# Patient Record
Sex: Female | Born: 1990 | Race: White | Hispanic: No | Marital: Single | Smoking: Never smoker
Health system: Southern US, Community
[De-identification: ages and names within clinical notes are randomized; demographics above are authoritative.]

## PROBLEM LIST (undated history)

## (undated) DIAGNOSIS — K219 Gastro-esophageal reflux disease without esophagitis: Secondary | ICD-10-CM

## (undated) HISTORY — DX: Gastro-esophageal reflux disease without esophagitis: K21.9

---
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
## 2018-10-22 LAB — OB RESULTS CONSOLE ANTIBODY SCREEN: Antibody Screen: NEGATIVE

## 2018-10-22 LAB — OB RESULTS CONSOLE ABO/RH: RH Type: POSITIVE

## 2018-10-22 LAB — OB RESULTS CONSOLE HEPATITIS B SURFACE ANTIGEN: Hepatitis B Surface Ag: NEGATIVE

## 2018-10-22 LAB — OB RESULTS CONSOLE GC/CHLAMYDIA
Chlamydia: NEGATIVE
Gonorrhea: NEGATIVE

## 2018-10-22 LAB — OB RESULTS CONSOLE RPR: RPR: NONREACTIVE

## 2018-10-22 LAB — OB RESULTS CONSOLE RUBELLA ANTIBODY, IGM: Rubella: IMMUNE

## 2018-10-22 LAB — OB RESULTS CONSOLE HIV ANTIBODY (ROUTINE TESTING): HIV: NONREACTIVE

## 2019-02-28 LAB — OB RESULTS CONSOLE GBS: GBS: NEGATIVE

## 2019-03-22 ENCOUNTER — Encounter (HOSPITAL_COMMUNITY): Payer: Self-pay | Admitting: *Deleted

## 2019-03-22 ENCOUNTER — Telehealth (HOSPITAL_COMMUNITY): Payer: Self-pay | Admitting: *Deleted

## 2019-03-22 NOTE — Telephone Encounter (Signed)
Preadmission screen  

## 2019-03-25 ENCOUNTER — Telehealth (HOSPITAL_COMMUNITY): Payer: Self-pay | Admitting: *Deleted

## 2019-03-25 NOTE — Telephone Encounter (Signed)
Preadmission screen  

## 2019-03-26 ENCOUNTER — Inpatient Hospital Stay (HOSPITAL_COMMUNITY)
Admission: AD | Admit: 2019-03-26 | Discharge: 2019-03-29 | DRG: 788 | Disposition: A | Discharge status: Home / Self Care | Payer: 59 | Attending: Obstetrics and Gynecology | Admitting: Obstetrics and Gynecology

## 2019-03-26 ENCOUNTER — Inpatient Hospital Stay (HOSPITAL_COMMUNITY): Payer: 59 | Admitting: Anesthesiology

## 2019-03-26 ENCOUNTER — Telehealth (HOSPITAL_COMMUNITY): Payer: Self-pay | Admitting: *Deleted

## 2019-03-26 ENCOUNTER — Encounter (HOSPITAL_COMMUNITY): Payer: Self-pay

## 2019-03-26 ENCOUNTER — Other Ambulatory Visit: Payer: Self-pay

## 2019-03-26 DIAGNOSIS — K219 Gastro-esophageal reflux disease without esophagitis: Secondary | ICD-10-CM | POA: Diagnosis present

## 2019-03-26 DIAGNOSIS — O9962 Diseases of the digestive system complicating childbirth: Secondary | ICD-10-CM | POA: Diagnosis present

## 2019-03-26 DIAGNOSIS — O339 Maternal care for disproportion, unspecified: Secondary | ICD-10-CM | POA: Diagnosis present

## 2019-03-26 DIAGNOSIS — O4292 Full-term premature rupture of membranes, unspecified as to length of time between rupture and onset of labor: Principal | ICD-10-CM | POA: Diagnosis present

## 2019-03-26 DIAGNOSIS — Z20828 Contact with and (suspected) exposure to other viral communicable diseases: Secondary | ICD-10-CM | POA: Diagnosis present

## 2019-03-26 DIAGNOSIS — Z3A40 40 weeks gestation of pregnancy: Secondary | ICD-10-CM | POA: Diagnosis not present

## 2019-03-26 LAB — SARS CORONAVIRUS 2 (TAT 6-24 HRS): SARS Coronavirus 2: NEGATIVE

## 2019-03-26 LAB — COMPREHENSIVE METABOLIC PANEL
ALT: 12 U/L (ref 0–44)
AST: 20 U/L (ref 15–41)
Albumin: 2.8 g/dL — ABNORMAL LOW (ref 3.5–5.0)
Alkaline Phosphatase: 109 U/L (ref 38–126)
Anion gap: 9 (ref 5–15)
BUN: 6 mg/dL (ref 6–20)
CO2: 23 mmol/L (ref 22–32)
Calcium: 8.8 mg/dL — ABNORMAL LOW (ref 8.9–10.3)
Chloride: 106 mmol/L (ref 98–111)
Creatinine, Ser: 0.44 mg/dL (ref 0.44–1.00)
GFR calc Af Amer: >60 mL/min (ref >60)
GFR calc non Af Amer: >60 mL/min (ref >60)
Glucose, Bld: 99 mg/dL (ref 70–99)
Potassium: 3.4 mmol/L — ABNORMAL LOW (ref 3.5–5.1)
Sodium: 138 mmol/L (ref 135–145)
Total Bilirubin: 0.6 mg/dL (ref 0.3–1.2)
Total Protein: 6.1 g/dL — ABNORMAL LOW (ref 6.5–8.1)

## 2019-03-26 LAB — PROTEIN / CREATININE RATIO, URINE
Creatinine, Urine: 78.18 mg/dL
Protein Creatinine Ratio: 0.18 mg/mg{Cre} — ABNORMAL HIGH (ref 0.00–0.15)
Total Protein, Urine: 14 mg/dL

## 2019-03-26 LAB — CBC
HCT: 34.5 % — ABNORMAL LOW (ref 36.0–46.0)
Hemoglobin: 11.5 g/dL — ABNORMAL LOW (ref 12.0–15.0)
MCH: 31.1 pg (ref 26.0–34.0)
MCHC: 33.3 g/dL (ref 30.0–36.0)
MCV: 93.2 fL (ref 80.0–100.0)
Platelets: 280 10*3/uL (ref 150–400)
RBC: 3.7 MIL/uL — ABNORMAL LOW (ref 3.87–5.11)
RDW: 11.7 % (ref 11.5–15.5)
WBC: 11 10*3/uL — ABNORMAL HIGH (ref 4.0–10.5)
nRBC: 0 % (ref 0.0–0.2)

## 2019-03-26 LAB — TYPE AND SCREEN
ABO/RH(D): B POS
Antibody Screen: NEGATIVE

## 2019-03-26 LAB — ABO/RH: ABO/RH(D): B POS

## 2019-03-26 MED ORDER — EPHEDRINE 5 MG/ML INJ: 10.0000 mg | INTRAVENOUS | Status: DC | PRN

## 2019-03-26 MED ORDER — OXYTOCIN BOLUS FROM INFUSION: 500.0000 mL | Freq: Once | INTRAVENOUS | Status: DC

## 2019-03-26 MED ORDER — TERBUTALINE SULFATE 1 MG/ML IJ SOLN: 0.2500 mg | Freq: Once | INTRAMUSCULAR | Status: DC | PRN

## 2019-03-26 MED ORDER — LIDOCAINE HCL (PF) 1 % IJ SOLN: 30.0000 mL | INTRAMUSCULAR | Status: DC | PRN

## 2019-03-26 MED ORDER — PHENYLEPHRINE 40 MCG/ML (10ML) SYRINGE FOR IV PUSH (FOR BLOOD PRESSURE SUPPORT): 80.0000 ug | PREFILLED_SYRINGE | INTRAVENOUS | Status: DC | PRN

## 2019-03-26 MED ORDER — LACTATED RINGERS IV SOLN
INTRAVENOUS | Status: DC
  Administered 2019-03-26 (×2): via INTRAVENOUS

## 2019-03-26 MED ORDER — LACTATED RINGERS IV SOLN: 500.0000 mL | Freq: Once | INTRAVENOUS | Status: DC

## 2019-03-26 MED ORDER — OXYTOCIN 40 UNITS IN NORMAL SALINE INFUSION - SIMPLE MED
1.0000 m[IU]/min | INTRAVENOUS | Status: DC
  Administered 2019-03-26: 2 m[IU]/min via INTRAVENOUS
  Filled 2019-03-26: qty 1000

## 2019-03-26 MED ORDER — DIPHENHYDRAMINE HCL 50 MG/ML IJ SOLN
12.5000 mg | Freq: Once | INTRAMUSCULAR | Status: AC
  Administered 2019-03-26: 12.5 mg via INTRAVENOUS
  Filled 2019-03-26: qty 1

## 2019-03-26 MED ORDER — OXYCODONE-ACETAMINOPHEN 5-325 MG PO TABS: 2.0000 | ORAL_TABLET | ORAL | Status: DC | PRN

## 2019-03-26 MED ORDER — LACTATED RINGERS IV SOLN: 500.0000 mL | INTRAVENOUS | Status: DC | PRN

## 2019-03-26 MED ORDER — FENTANYL-BUPIVACAINE-NACL 0.5-0.125-0.9 MG/250ML-% EP SOLN
12.0000 mL/h | EPIDURAL | Status: DC | PRN
  Filled 2019-03-26: qty 250

## 2019-03-26 MED ORDER — DIPHENHYDRAMINE HCL 50 MG/ML IJ SOLN: 12.5000 mg | INTRAMUSCULAR | Status: DC | PRN

## 2019-03-26 MED ORDER — LIDOCAINE-EPINEPHRINE (PF) 2 %-1:200000 IJ SOLN
INTRAMUSCULAR | Status: DC | PRN
  Administered 2019-03-26 (×2): 2 mL via EPIDURAL
  Administered 2019-03-27 (×2): 5 mL via EPIDURAL
  Administered 2019-03-27: 10 mL via EPIDURAL

## 2019-03-26 MED ORDER — SODIUM CHLORIDE (PF) 0.9 % IJ SOLN
INTRAMUSCULAR | Status: DC | PRN
  Administered 2019-03-26: 12 mL/h via EPIDURAL

## 2019-03-26 MED ORDER — FLEET ENEMA 7-19 GM/118ML RE ENEM: 1.0000 | ENEMA | RECTAL | Status: DC | PRN

## 2019-03-26 MED ORDER — FENTANYL CITRATE (PF) 100 MCG/2ML IJ SOLN
INTRAMUSCULAR | Status: AC
  Filled 2019-03-26: qty 2

## 2019-03-26 MED ORDER — SOD CITRATE-CITRIC ACID 500-334 MG/5ML PO SOLN: 30.0000 mL | ORAL | Status: DC | PRN

## 2019-03-26 MED ORDER — SODIUM CHLORIDE 0.9 % IV SOLN
500.0000 mg | Freq: Once | INTRAVENOUS | Status: AC
  Administered 2019-03-26: 500 mg via INTRAVENOUS
  Filled 2019-03-26: qty 500

## 2019-03-26 MED ORDER — OXYTOCIN 40 UNITS IN NORMAL SALINE INFUSION - SIMPLE MED: 2.5000 [IU]/h | INTRAVENOUS | Status: DC

## 2019-03-26 MED ORDER — ONDANSETRON HCL 4 MG/2ML IJ SOLN
4.0000 mg | Freq: Four times a day (QID) | INTRAMUSCULAR | Status: DC | PRN
  Administered 2019-03-27: 02:00:00 4 mg via INTRAVENOUS

## 2019-03-26 MED ORDER — FENTANYL CITRATE (PF) 100 MCG/2ML IJ SOLN
100.0000 ug | INTRAMUSCULAR | Status: DC | PRN
  Administered 2019-03-26 (×2): 100 ug via INTRAVENOUS
  Filled 2019-03-26: qty 2

## 2019-03-26 MED ORDER — OXYCODONE-ACETAMINOPHEN 5-325 MG PO TABS: 1.0000 | ORAL_TABLET | ORAL | Status: DC | PRN

## 2019-03-26 MED ORDER — ACETAMINOPHEN 325 MG PO TABS: 650.0000 mg | ORAL_TABLET | ORAL | Status: DC | PRN

## 2019-03-26 NOTE — Anesthesia Preprocedure Evaluation (Signed)
Anesthesia Evaluation    Reviewed: Allergy & Precautions, Patient's Chart, lab work & pertinent test results  Airway Mallampati: II  TM Distance: >3 FB Neck ROM: Full    Dental no notable dental hx.    Pulmonary neg pulmonary ROS,    Pulmonary exam normal breath sounds clear to auscultation       Cardiovascular negative cardio ROS Normal cardiovascular exam Rhythm:Regular Rate:Normal     Neuro/Psych negative neurological ROS  negative psych ROS   GI/Hepatic Neg liver ROS, GERD  ,  Endo/Other  negative endocrine ROS  Renal/GU negative Renal ROS  negative genitourinary   Musculoskeletal negative musculoskeletal ROS (+)   Abdominal   Peds  Hematology negative hematology ROS (+)   Anesthesia Other Findings   Reproductive/Obstetrics (+) Pregnancy                             Anesthesia Physical Anesthesia Plan  ASA: II  Anesthesia Plan: Epidural   Post-op Pain Management:    Induction:   PONV Risk Score and Plan: Treatment may vary due to age or medical condition  Airway Management Planned: Natural Airway  Additional Equipment:   Intra-op Plan:   Post-operative Plan:   Informed Consent: I have reviewed the patients History and Physical, chart, labs and discussed the procedure including the risks, benefits and alternatives for the proposed anesthesia with the patient or authorized representative who has indicated his/her understanding and acceptance.       Plan Discussed with: Anesthesiologist  Anesthesia Plan Comments: (Patient identified. Risks, benefits, options discussed with patient including but not limited to bleeding, infection, nerve damage, paralysis, failed block, incomplete pain control, headache, blood pressure changes, nausea, vomiting, reactions to medication, itching, and post partum back pain. Confirmed with bedside nurse the patient's most recent platelet count.  Confirmed with the patient that they are not taking any anticoagulation, have any bleeding history or any family history of bleeding disorders. Patient expressed understanding and wishes to proceed. All questions were answered. )        Anesthesia Quick Evaluation

## 2019-03-26 NOTE — Progress Notes (Signed)
Labor Note  S: More comfortable s/p epidural. Occ nausea  O: BP 126/87   Pulse 64   Temp 98 F (36.7 C) (Oral)   Resp 19   Ht 5\' 6"  (1.676 m)   Wt 69.4 kg   SpO2 94%   BMI 24.69 kg/m  CE: 6/90/-1, bloody show present. FHR: Baseline 110, +accels, + early decels, mod variability TOCO 2-3, pitocin at 48mU/min  A/P: This is a 28 y.o. G1P0 at [redacted]w[redacted]d  admitted w/ PROM. GBS neg.  FWB: Category 1   MWB: Desires epidural. Asx from PreE standpoint. PreE labs WNL    *Rupture > 12 hrs: add IV azithromycin 500mg  at this time Labor course: Active labor at this time. Peanut ball  Anticipate SVD

## 2019-03-26 NOTE — Progress Notes (Signed)
Labor Note  S: Patient feeling more painful contractions and continued leakage. Denies VB or PReE symptoms  O: BP (!) 141/92   Pulse 79   Temp 97.8 F (36.6 C) (Axillary)   Resp 18   Ht 5\' 6"  (1.676 m)   Wt 69.4 kg   SpO2 100%   BMI 24.69 kg/m  CE: 2-3/50/-2 FHR: Baseline 125, +accels, -decels, modvariability TOCO 2-3, pitocin at 37mU/min  A/P: This is a 28 y.o. G1P0 at [redacted]w[redacted]d  admitted w/ PROM. GBS neg.  FWB: Category 1   MWB: Desires epidural. Asx from PreE standpoint. PreE labs WNL, uPC pending Labor course: Latent labor, continue to titrate  Anticipate SVD

## 2019-03-26 NOTE — Telephone Encounter (Signed)
Preadmission screen  

## 2019-03-26 NOTE — H&P (Signed)
Jo [name] is a 28 y.o. female presenting for rupture of membranes at 0500. Evaluated in clinic, + ferning and pooling. +FM, denies VB, CTX  Late entry to care at 17wks, GERD. Otherwise uncomplicated, 1hr 87, GBS neg OB History    Gravida  1   Para      Term      Preterm      AB      Living        SAB      TAB      Ectopic      Multiple      Live Births             Past Medical History:  Diagnosis Date  . GERD (gastroesophageal reflux disease)    Past Surgical History:  Procedure Laterality Date  . NO PAST SURGERIES     Family History: family history is not on file. Social History:  reports that she has never smoked. She has never used smokeless tobacco. She reports previous alcohol use. She reports previous drug use.     Maternal Diabetes: No Genetic Screening: Normal (AFP) Maternal Ultrasounds/Referrals: Normal Had repeat scan in 3rd trim for S<D, growth WNL Fetal Ultrasounds or other Referrals:  None Maternal Substance Abuse:  No Significant Maternal Medications:  None Significant Maternal Lab Results:  Group B Strep negative Other Comments:  None  Review of Systems  Constitutional: Negative for chills and fever.  Eyes: Negative for blurred vision.  Respiratory: Negative for shortness of breath.   Cardiovascular: Negative for chest pain, palpitations and leg swelling.  Gastrointestinal: Negative for abdominal pain, nausea and vomiting.  Neurological: Negative for dizziness, weakness and headaches.  Psychiatric/Behavioral: Negative for suicidal ideas.   Maternal Medical History:  Reason for admission: Rupture of membranes.  Nausea.  Fetal activity: Perceived fetal activity is normal.    Prenatal complications: no prenatal complications Prenatal Complications - Diabetes: none.    Dilation: 1.5 Effacement (%): 50 Station: -3 Exam by:: Irma Newness, RN ) Blood pressure (!) 141/87, pulse 78, temperature 98.4 F (36.9 C), temperature  source Oral, resp. rate 19, height 5\' 6"  (1.676 m), weight 69.4 kg.  TOCO q6-86m, pitocin at 44mU/m   Exam Physical Exam  Constitutional: She is oriented to person, place, and time. She appears well-developed and well-nourished. No distress.  HENT:  Head: Normocephalic and atraumatic.  Eyes: Pupils are equal, round, and reactive to light.  Neck: Normal range of motion. Neck supple.  Cardiovascular: Normal rate and regular rhythm. Exam reveals no gallop.  No murmur heard. GI: There is abdominal tenderness (w/ occ ctxs). There is no rebound and no guarding.  Genitourinary:    Vagina and uterus normal.     Genitourinary Comments: +ferning/pooling in office   Musculoskeletal: Normal range of motion.  Neurological: She is alert and oriented to person, place, and time.  Skin: Skin is warm and dry.    Prenatal labs: ABO, Rh: --/--/PENDING (11/24 1234) Antibody: PENDING (11/24 1234) Rubella: Immune (06/22 0000) RPR: Nonreactive (06/22 0000)  HBsAg: Negative (06/22 0000)  HIV: Non-reactive (06/22 0000)  GBS: Negative/-- (10/29 0000)   Assessment/Plan: This is a 28yo G1P0 at 30 0/7 admitted with PROM. GBS neg, EFW 3250g. On admission, elevated BP  Admit to L&D. Augment with pitocin. Planning on epidural. Baby boy, hospital circ [facility]).   Elevated Bps - asx. PreE labs, will need cath specimen for [facility]   Jo [name] 03/26/2019, 1:20 PM

## 2019-03-26 NOTE — Anesthesia Procedure Notes (Signed)
Epidural Patient location during procedure: OB Start time: 03/26/2019 6:40 PM End time: 03/26/2019 6:55 PM  Staffing Anesthesiologist: Freddrick March, MD Performed: anesthesiologist   Preanesthetic Checklist Completed: patient identified, pre-op evaluation, timeout performed, IV checked, risks and benefits discussed and monitors and equipment checked  Epidural Patient position: sitting Prep: site prepped and draped and DuraPrep Patient monitoring: continuous pulse ox, blood pressure, heart rate and cardiac monitor Approach: midline Location: L3-L4 Injection technique: LOR air  Needle:  Needle type: Tuohy  Needle gauge: 17 G Needle length: 9 cm Needle insertion depth: 4 cm Catheter type: closed end flexible Catheter size: 19 Gauge Catheter at skin depth: 10 cm Test dose: negative  Assessment Sensory level: T8 Events: blood not aspirated, injection not painful, no injection resistance, negative IV test and no paresthesia  Additional Notes Patient identified. Risks/Benefits/Options discussed with patient including but not limited to bleeding, infection, nerve damage, paralysis, failed block, incomplete pain control, headache, blood pressure changes, nausea, vomiting, reactions to medication both or allergic, itching and postpartum back pain. Confirmed with bedside nurse the patient's most recent platelet count. Confirmed with patient that they are not currently taking any anticoagulation, have any bleeding history or any family history of bleeding disorders. Patient expressed understanding and wished to proceed. All questions were answered. Sterile technique was used throughout the entire procedure. Please see nursing notes for vital signs. Test dose was given through epidural catheter and negative prior to continuing to dose epidural or start infusion. Warning signs of high block given to the patient including shortness of breath, tingling/numbness in hands, complete motor block,  or any concerning symptoms with instructions to call for help. Patient was given instructions on fall risk and not to get out of bed. All questions and concerns addressed with instructions to call with any issues or inadequate analgesia.  Reason for block:procedure for pain

## 2019-03-27 ENCOUNTER — Other Ambulatory Visit (HOSPITAL_COMMUNITY): Payer: Self-pay

## 2019-03-27 ENCOUNTER — Encounter (HOSPITAL_COMMUNITY): Payer: Self-pay

## 2019-03-27 ENCOUNTER — Encounter (HOSPITAL_COMMUNITY)
Admission: AD | Disposition: A | Discharge status: Home / Self Care | Payer: Self-pay | Source: Home / Self Care | Attending: Obstetrics and Gynecology

## 2019-03-27 LAB — CBC
HCT: 27.3 % — ABNORMAL LOW (ref 36.0–46.0)
Hemoglobin: 9.1 g/dL — ABNORMAL LOW (ref 12.0–15.0)
MCH: 31.1 pg (ref 26.0–34.0)
MCHC: 33.3 g/dL (ref 30.0–36.0)
MCV: 93.2 fL (ref 80.0–100.0)
Platelets: 244 10*3/uL (ref 150–400)
RBC: 2.93 MIL/uL — ABNORMAL LOW (ref 3.87–5.11)
RDW: 11.7 % (ref 11.5–15.5)
WBC: 19.1 10*3/uL — ABNORMAL HIGH (ref 4.0–10.5)
nRBC: 0 % (ref 0.0–0.2)

## 2019-03-27 LAB — RPR: RPR Ser Ql: NONREACTIVE

## 2019-03-27 SURGERY — Surgical Case
Anesthesia: Epidural | Wound class: Clean Contaminated

## 2019-03-27 MED ORDER — DIPHENHYDRAMINE HCL 25 MG PO CAPS: 25.0000 mg | ORAL_CAPSULE | Freq: Four times a day (QID) | ORAL | Status: DC | PRN

## 2019-03-27 MED ORDER — FENTANYL CITRATE (PF) 100 MCG/2ML IJ SOLN
INTRAMUSCULAR | Status: AC
  Filled 2019-03-27: qty 2

## 2019-03-27 MED ORDER — MORPHINE SULFATE (PF) 0.5 MG/ML IJ SOLN
INTRAMUSCULAR | Status: DC | PRN
  Administered 2019-03-27: 2 mg via INTRAVENOUS
  Administered 2019-03-27: 3 mg via EPIDURAL

## 2019-03-27 MED ORDER — ZOLPIDEM TARTRATE 5 MG PO TABS: 5.0000 mg | ORAL_TABLET | Freq: Every evening | ORAL | Status: DC | PRN

## 2019-03-27 MED ORDER — FENTANYL CITRATE (PF) 100 MCG/2ML IJ SOLN: 25.0000 ug | INTRAMUSCULAR | Status: DC | PRN

## 2019-03-27 MED ORDER — DEXAMETHASONE SODIUM PHOSPHATE 4 MG/ML IJ SOLN
INTRAMUSCULAR | Status: AC
  Filled 2019-03-27: qty 1

## 2019-03-27 MED ORDER — LACTATED RINGERS IV SOLN
INTRAVENOUS | Status: DC | PRN
  Administered 2019-03-27 (×2): via INTRAVENOUS

## 2019-03-27 MED ORDER — OXYTOCIN 40 UNITS IN NORMAL SALINE INFUSION - SIMPLE MED: 2.5000 [IU]/h | INTRAVENOUS | Status: AC

## 2019-03-27 MED ORDER — OXYTOCIN 40 UNITS IN NORMAL SALINE INFUSION - SIMPLE MED
INTRAVENOUS | Status: DC | PRN
  Administered 2019-03-27: 40 [IU] via INTRAVENOUS

## 2019-03-27 MED ORDER — DOCUSATE SODIUM 100 MG PO CAPS
100.0000 mg | ORAL_CAPSULE | Freq: Two times a day (BID) | ORAL | Status: DC
  Administered 2019-03-27 – 2019-03-29 (×5): 100 mg via ORAL
  Filled 2019-03-27 (×5): qty 1

## 2019-03-27 MED ORDER — NALOXONE HCL 4 MG/10ML IJ SOLN
1.0000 ug/kg/h | INTRAVENOUS | Status: DC | PRN
  Filled 2019-03-27: qty 5

## 2019-03-27 MED ORDER — METOCLOPRAMIDE HCL 5 MG/ML IJ SOLN
INTRAMUSCULAR | Status: DC | PRN
  Administered 2019-03-27: 10 mg via INTRAVENOUS

## 2019-03-27 MED ORDER — METOCLOPRAMIDE HCL 5 MG/ML IJ SOLN
INTRAMUSCULAR | Status: AC
  Filled 2019-03-27: qty 2

## 2019-03-27 MED ORDER — OXYCODONE HCL 5 MG PO TABS: 10.0000 mg | ORAL_TABLET | Freq: Four times a day (QID) | ORAL | Status: DC | PRN

## 2019-03-27 MED ORDER — DIPHENHYDRAMINE HCL 50 MG/ML IJ SOLN: 12.5000 mg | INTRAMUSCULAR | Status: DC | PRN

## 2019-03-27 MED ORDER — OXYCODONE HCL 5 MG PO TABS
5.0000 mg | ORAL_TABLET | Freq: Four times a day (QID) | ORAL | Status: DC | PRN
  Administered 2019-03-27 – 2019-03-28 (×3): 5 mg via ORAL
  Filled 2019-03-27 (×3): qty 1

## 2019-03-27 MED ORDER — DIPHENHYDRAMINE HCL 50 MG/ML IJ SOLN
INTRAMUSCULAR | Status: AC
  Filled 2019-03-27: qty 1

## 2019-03-27 MED ORDER — SIMETHICONE 80 MG PO CHEW
80.0000 mg | CHEWABLE_TABLET | ORAL | Status: DC
  Administered 2019-03-27 – 2019-03-29 (×2): 80 mg via ORAL
  Filled 2019-03-27 (×2): qty 1

## 2019-03-27 MED ORDER — LACTATED RINGERS IV SOLN
INTRAVENOUS | Status: DC
  Administered 2019-03-27: 08:00:00 via INTRAVENOUS

## 2019-03-27 MED ORDER — DIPHENHYDRAMINE HCL 25 MG PO CAPS: 25.0000 mg | ORAL_CAPSULE | ORAL | Status: DC | PRN

## 2019-03-27 MED ORDER — PRENATAL MULTIVITAMIN CH: 1.0000 | ORAL_TABLET | Freq: Every day | ORAL | Status: DC

## 2019-03-27 MED ORDER — SIMETHICONE 80 MG PO CHEW
80.0000 mg | CHEWABLE_TABLET | Freq: Three times a day (TID) | ORAL | Status: DC
  Administered 2019-03-27 – 2019-03-28 (×5): 80 mg via ORAL
  Filled 2019-03-27 (×7): qty 1

## 2019-03-27 MED ORDER — PANTOPRAZOLE SODIUM 40 MG PO TBEC
40.0000 mg | DELAYED_RELEASE_TABLET | Freq: Every day | ORAL | Status: DC
  Administered 2019-03-27 – 2019-03-29 (×3): 40 mg via ORAL
  Filled 2019-03-27 (×3): qty 1

## 2019-03-27 MED ORDER — MENTHOL 3 MG MT LOZG: 1.0000 | LOZENGE | OROMUCOSAL | Status: DC | PRN

## 2019-03-27 MED ORDER — NALBUPHINE HCL 10 MG/ML IJ SOLN
5.0000 mg | INTRAMUSCULAR | Status: DC | PRN
  Filled 2019-03-27: qty 0.5

## 2019-03-27 MED ORDER — COCONUT OIL OIL: 1.0000 "application " | TOPICAL_OIL | Status: DC | PRN

## 2019-03-27 MED ORDER — STERILE WATER FOR IRRIGATION IR SOLN
Status: DC | PRN
  Administered 2019-03-27: 1

## 2019-03-27 MED ORDER — ONDANSETRON HCL 4 MG/2ML IJ SOLN
INTRAMUSCULAR | Status: AC
  Filled 2019-03-27: qty 2

## 2019-03-27 MED ORDER — ONDANSETRON HCL 4 MG/2ML IJ SOLN: 4.0000 mg | Freq: Three times a day (TID) | INTRAMUSCULAR | Status: DC | PRN

## 2019-03-27 MED ORDER — OXYTOCIN 40 UNITS IN NORMAL SALINE INFUSION - SIMPLE MED
INTRAVENOUS | Status: AC
  Filled 2019-03-27: qty 1000

## 2019-03-27 MED ORDER — DEXAMETHASONE SODIUM PHOSPHATE 4 MG/ML IJ SOLN
INTRAMUSCULAR | Status: DC | PRN
  Administered 2019-03-27: 4 mg via INTRAVENOUS

## 2019-03-27 MED ORDER — CEFAZOLIN SODIUM-DEXTROSE 2-3 GM-%(50ML) IV SOLR
INTRAVENOUS | Status: DC | PRN
  Administered 2019-03-27: 2 g via INTRAVENOUS

## 2019-03-27 MED ORDER — WITCH HAZEL-GLYCERIN EX PADS: 1.0000 "application " | MEDICATED_PAD | CUTANEOUS | Status: DC | PRN

## 2019-03-27 MED ORDER — KETOROLAC TROMETHAMINE 30 MG/ML IJ SOLN
30.0000 mg | Freq: Four times a day (QID) | INTRAMUSCULAR | Status: AC | PRN
  Administered 2019-03-27: 30 mg via INTRAMUSCULAR

## 2019-03-27 MED ORDER — PHENYLEPHRINE 40 MCG/ML (10ML) SYRINGE FOR IV PUSH (FOR BLOOD PRESSURE SUPPORT)
PREFILLED_SYRINGE | INTRAVENOUS | Status: AC
  Filled 2019-03-27: qty 10

## 2019-03-27 MED ORDER — CEFAZOLIN SODIUM-DEXTROSE 2-4 GM/100ML-% IV SOLN
INTRAVENOUS | Status: AC
  Filled 2019-03-27: qty 100

## 2019-03-27 MED ORDER — NALBUPHINE HCL 10 MG/ML IJ SOLN: 5.0000 mg | Freq: Once | INTRAMUSCULAR | Status: DC | PRN

## 2019-03-27 MED ORDER — DIPHENHYDRAMINE HCL 50 MG/ML IJ SOLN
INTRAMUSCULAR | Status: DC | PRN
  Administered 2019-03-27: 25 mg via INTRAVENOUS

## 2019-03-27 MED ORDER — KETOROLAC TROMETHAMINE 30 MG/ML IJ SOLN
INTRAMUSCULAR | Status: AC
  Filled 2019-03-27: qty 1

## 2019-03-27 MED ORDER — MORPHINE SULFATE (PF) 0.5 MG/ML IJ SOLN
INTRAMUSCULAR | Status: AC
  Filled 2019-03-27: qty 10

## 2019-03-27 MED ORDER — SIMETHICONE 80 MG PO CHEW: 80.0000 mg | CHEWABLE_TABLET | ORAL | Status: DC | PRN

## 2019-03-27 MED ORDER — SODIUM CHLORIDE 0.9 % IR SOLN
Status: DC | PRN
  Administered 2019-03-27 (×2): 1

## 2019-03-27 MED ORDER — IBUPROFEN 800 MG PO TABS
800.0000 mg | ORAL_TABLET | Freq: Four times a day (QID) | ORAL | Status: DC
  Administered 2019-03-28 – 2019-03-29 (×6): 800 mg via ORAL
  Filled 2019-03-27 (×6): qty 1

## 2019-03-27 MED ORDER — PRENATAL MULTIVITAMIN CH
1.0000 | ORAL_TABLET | Freq: Every day | ORAL | Status: DC
  Administered 2019-03-27 – 2019-03-29 (×3): 1 via ORAL
  Filled 2019-03-27 (×3): qty 1

## 2019-03-27 MED ORDER — PHENYLEPHRINE HCL (PRESSORS) 10 MG/ML IV SOLN
INTRAVENOUS | Status: DC | PRN
  Administered 2019-03-27 (×2): 80 ug via INTRAVENOUS

## 2019-03-27 MED ORDER — SCOPOLAMINE 1 MG/3DAYS TD PT72: 1.0000 | MEDICATED_PATCH | Freq: Once | TRANSDERMAL | Status: DC

## 2019-03-27 MED ORDER — SENNOSIDES-DOCUSATE SODIUM 8.6-50 MG PO TABS
2.0000 | ORAL_TABLET | ORAL | Status: DC
  Administered 2019-03-29: 2 via ORAL
  Filled 2019-03-27 (×2): qty 2

## 2019-03-27 MED ORDER — ACETAMINOPHEN 500 MG PO TABS
1000.0000 mg | ORAL_TABLET | Freq: Four times a day (QID) | ORAL | Status: AC
  Administered 2019-03-27 – 2019-03-28 (×4): 1000 mg via ORAL
  Filled 2019-03-27 (×4): qty 2

## 2019-03-27 MED ORDER — HYDROMORPHONE HCL 1 MG/ML IJ SOLN
INTRAMUSCULAR | Status: DC | PRN
  Administered 2019-03-27 (×2): 0.5 mg via INTRAVENOUS

## 2019-03-27 MED ORDER — NALOXONE HCL 0.4 MG/ML IJ SOLN: 0.4000 mg | INTRAMUSCULAR | Status: DC | PRN

## 2019-03-27 MED ORDER — FENTANYL CITRATE (PF) 100 MCG/2ML IJ SOLN
INTRAMUSCULAR | Status: DC | PRN
  Administered 2019-03-27: 100 ug via EPIDURAL

## 2019-03-27 MED ORDER — FENTANYL CITRATE (PF) 100 MCG/2ML IJ SOLN: INTRAMUSCULAR | Status: DC | PRN

## 2019-03-27 MED ORDER — KETOROLAC TROMETHAMINE 30 MG/ML IJ SOLN
30.0000 mg | Freq: Four times a day (QID) | INTRAMUSCULAR | Status: AC
  Administered 2019-03-27 (×3): 30 mg via INTRAVENOUS
  Filled 2019-03-27 (×3): qty 1

## 2019-03-27 MED ORDER — SODIUM CHLORIDE 0.9% FLUSH: 3.0000 mL | INTRAVENOUS | Status: DC | PRN

## 2019-03-27 MED ORDER — SODIUM CHLORIDE 0.9 % IV SOLN
INTRAVENOUS | Status: DC | PRN
  Administered 2019-03-27: 02:00:00 via INTRAVENOUS

## 2019-03-27 MED ORDER — ACETAMINOPHEN 10 MG/ML IV SOLN: 1000.0000 mg | Freq: Once | INTRAVENOUS | Status: DC | PRN

## 2019-03-27 MED ORDER — DIBUCAINE (PERIANAL) 1 % EX OINT: 1.0000 "application " | TOPICAL_OINTMENT | CUTANEOUS | Status: DC | PRN

## 2019-03-27 MED ORDER — TETANUS-DIPHTH-ACELL PERTUSSIS 5-2.5-18.5 LF-MCG/0.5 IM SUSP: 0.5000 mL | Freq: Once | INTRAMUSCULAR | Status: DC

## 2019-03-27 MED ORDER — KETOROLAC TROMETHAMINE 30 MG/ML IJ SOLN: 30.0000 mg | Freq: Four times a day (QID) | INTRAMUSCULAR | Status: AC | PRN

## 2019-03-27 MED ORDER — HYDROMORPHONE HCL 1 MG/ML IJ SOLN
INTRAMUSCULAR | Status: AC
  Filled 2019-03-27: qty 1

## 2019-03-27 MED ORDER — KETOROLAC TROMETHAMINE 30 MG/ML IJ SOLN: 30.0000 mg | Freq: Once | INTRAMUSCULAR | Status: DC | PRN

## 2019-03-27 SURGICAL SUPPLY — 37 items
CHLORAPREP W/TINT 26ML (MISCELLANEOUS) ×3 IMPLANT
CLAMP CORD UMBIL (MISCELLANEOUS) IMPLANT
CLOTH BEACON ORANGE TIMEOUT ST (SAFETY) ×3 IMPLANT
DERMABOND ADVANCED (GAUZE/BANDAGES/DRESSINGS) ×2
DERMABOND ADVANCED .7 DNX12 (GAUZE/BANDAGES/DRESSINGS) ×1 IMPLANT
DRSG OPSITE POSTOP 4X10 (GAUZE/BANDAGES/DRESSINGS) ×3 IMPLANT
ELECT REM PT RETURN 9FT ADLT (ELECTROSURGICAL) ×3
ELECTRODE REM PT RTRN 9FT ADLT (ELECTROSURGICAL) ×1 IMPLANT
EXTRACTOR VACUUM BELL STYLE (SUCTIONS) IMPLANT
GAUZE SPONGE 4X4 12PLY STRL LF (GAUZE/BANDAGES/DRESSINGS) IMPLANT
GLOVE BIO SURGEON STRL SZ 6 (GLOVE) ×3 IMPLANT
GLOVE BIOGEL PI IND STRL 6.5 (GLOVE) ×1 IMPLANT
GLOVE BIOGEL PI IND STRL 7.0 (GLOVE) ×2 IMPLANT
GLOVE BIOGEL PI INDICATOR 6.5 (GLOVE) ×2
GLOVE BIOGEL PI INDICATOR 7.0 (GLOVE) ×4
GOWN STRL REUS W/TWL LRG LVL3 (GOWN DISPOSABLE) ×6 IMPLANT
HEMOSTAT SURGICEL 4X8 (HEMOSTASIS) ×3 IMPLANT
KIT ABG SYR 3ML LUER SLIP (SYRINGE) ×3 IMPLANT
NEEDLE HYPO 25X5/8 SAFETYGLIDE (NEEDLE) ×3 IMPLANT
NS IRRIG 1000ML POUR BTL (IV SOLUTION) ×3 IMPLANT
PACK C SECTION WH (CUSTOM PROCEDURE TRAY) ×3 IMPLANT
PAD ABD 8X10 STRL (GAUZE/BANDAGES/DRESSINGS) IMPLANT
PAD OB MATERNITY 4.3X12.25 (PERSONAL CARE ITEMS) ×3 IMPLANT
PENCIL SMOKE EVAC W/HOLSTER (ELECTROSURGICAL) ×3 IMPLANT
RTRCTR C-SECT PINK 25CM LRG (MISCELLANEOUS) IMPLANT
SUT PDS AB 0 CTX 36 PDP370T (SUTURE) IMPLANT
SUT PLAIN 2 0 (SUTURE)
SUT PLAIN ABS 2-0 CT1 27XMFL (SUTURE) IMPLANT
SUT VIC AB 0 CT1 36 (SUTURE) ×15 IMPLANT
SUT VIC AB 2-0 CT1 27 (SUTURE) ×4
SUT VIC AB 2-0 CT1 TAPERPNT 27 (SUTURE) ×2 IMPLANT
SUT VIC AB 3-0 SH 27 (SUTURE)
SUT VIC AB 3-0 SH 27X BRD (SUTURE) IMPLANT
SUT VIC AB 4-0 KS 27 (SUTURE) ×3 IMPLANT
TOWEL OR 17X24 6PK STRL BLUE (TOWEL DISPOSABLE) ×3 IMPLANT
TRAY FOLEY W/BAG SLVR 14FR LF (SET/KITS/TRAYS/PACK) ×3 IMPLANT
WATER STERILE IRR 1000ML POUR (IV SOLUTION) ×3 IMPLANT

## 2019-03-27 NOTE — Progress Notes (Signed)
Subjective: Postpartum Day 0: Cesarean Delivery Patient reports incisional pain and tolerating PO.  Nl lochia, pain controlled  Objective: Vital signs in last 24 hours: Temp:  [96.3 F (35.7 C)-99.4 F (37.4 C)] 98.7 F (37.1 C) (11/25 0747) Pulse Rate:  [62-103] 83 (11/25 0747) Resp:  [13-21] 20 (11/25 0747) BP: (102-160)/(61-108) 124/75 (11/25 0747) SpO2:  [93 %-100 %] 95 % (11/25 0747) Weight:  [69.4 kg] 69.4 kg (11/24 1230)  Physical Exam:  General: alert and no distress Lochia: appropriate Uterine Fundus: firm Incision: healing well DVT Evaluation: No evidence of DVT seen on physical exam.  Recent Labs    03/26/19 1233 03/27/19 0527  HGB 11.5* 9.1*  HCT 34.5* 27.3*    Assessment/Plan: Status post Cesarean section. Doing well postoperatively.  Continue current care.  Genessis Flanary Bovard-Stuckert 03/27/2019, 8:58 AM

## 2019-03-27 NOTE — Progress Notes (Signed)
[facility] 5th floor called to give report to receiving RN per SBAR.

## 2019-03-27 NOTE — Anesthesia Postprocedure Evaluation (Signed)
Anesthesia Post Note  Patient: Jo [name]  Procedure(s) Performed: CESAREAN SECTION (N/A )     Patient location during evaluation: PACU Anesthesia Type: Epidural Level of consciousness: oriented and awake and alert Pain management: pain level controlled Vital Signs Assessment: post-procedure vital signs reviewed and stable Respiratory status: spontaneous breathing, respiratory function stable and patient connected to nasal cannula oxygen Cardiovascular status: blood pressure returned to baseline and stable Postop Assessment: no headache, no backache, no apparent nausea or vomiting and epidural receding Anesthetic complications: no    Last Vitals:  Vitals:   03/27/19 0400 03/27/19 0425  BP:  124/62  Pulse: (!) 102 97  Resp: (!) 21 18  Temp:  37 C  SpO2: 99% 97%    Last Pain:  Vitals:   03/27/19 0425  TempSrc: Axillary  PainSc: 0-No pain   Pain Goal: Patients Stated Pain Goal: 5 (03/26/19 1755)              Epidural/Spinal Function Cutaneous sensation: Able to Wiggle Toes (03/27/19 0425), Patient able to flex knees: Yes (03/27/19 0425), Patient able to lift hips off bed: Yes (03/27/19 0425), Back pain beyond tenderness at insertion site: No (03/27/19 0425), Progressively worsening motor and/or sensory loss: No (03/27/19 0425), Bowel and/or bladder incontinence post epidural: No (03/27/19 0425)  [place]

## 2019-03-27 NOTE — Op Note (Signed)
C-Section Operative Note  Primary Low Transverse Cesarean Section   Date: 03/27/19  Preoperative Diagnosis: IUP @ 40 1/7, failed operative delivery Postoperative Diagnosis: Same as above; cephalopelvic disporportion Assistant: Dr Loma Boston Indications: Failed operative delivery Findings: Viable female infant weighing 3521 with APGARS of 8 and 9 at 1 and 5 minutes, respectively. Normal appearing uterus, bilateral fallopian tubes and ovaries. Specimens: Placenta with calcifications evident EBL 533 IVF 2800 UOP 100  Cord gas: pH art 7.308 In OR 0145, skin 0153, baby 0157  Patient Course: Patient initially admitted on 11/24 @ 40 0/7 with PROM after confirming rupture in-office which patient stated occurred at 0500. Patient admitted and started on pitocin for augmentation. Patient suibsequently made change and was complete with strong urge to push at approx 0100. As per earlier note, patient met criteria for operative vaginal delivery and Mity Vac vacuum was attempted x3. After last pop-off, stat section called for failed operative delivery. FHRs 70s-100s at time of call. IV azithromycin 568m had been ordered earlier as patient was >12hrs ruptured. 2g Ancef on call to OR  Consent:  R/B/A of cesarean section discussed with patient. Alternative would be vaginal delivery which would mean shorter postpartum stay and decreased risk of bleeding. Risks of section include infection of the uterus, pelvic organs, or skin, inadvertent injury to internal organs, such as bowel or bladder. If there is major injury, extensive surgery may be required. If injury is minor, it may be treated with relative ease. Discussed possibility of excessive blood loss and transfusion. If bleeding cannot be controlled using medical or minor surgical methods, a cesarean hysterectomy may be performed which would mean no future fertility. Patient accepts the possibility of blood transfusion, if necessary. Patient understands and  agrees to move forward with section.   Operative Procedure: Patient was taken to the operating room where epidural anesthesia was found to be adequate by Allis clamp test. She was prepped and draped in the normal sterile fashion in the dorsal supine position with a leftward tilt. An appropriate time out was performed. A Pfannenstiel skin incision was then made with the scalpel and carried through to the underlying layer of fascia by sharp dissection. The fascia was nicked in the midline and the incision was extended laterally bluntly. The superior aspect and inferior aspect of the rectus fascia were bluntly dissected off of the bellies. Rectus muscles were separated in the midline and the peritoneal cavity entered bluntly. The peritoneal incision was then extended both superiorly and inferiorly with careful attention to avoid both bowel and bladder. The Richardson retractor with bladder blade were then placed within the incision and the lower uterine segment exposed. The bladder flap was developed with Metzenbaum scissors and pushed away from the lower uterine segment. The lower uterine segment was then incised in a low transverse fashion and the cavity itself entered bluntly. The incision was extended bluntly. Amniotic sac was ruptured and fluid was noted to be clear in color. The infant's head was noted to be very low within pelvis which was felt to be narrow and consistent with CPD. The infant's head was then lifted with aid from DJanann August RN, from below sterile drape using hand placed in vagina. After elevation, infant's head was delivered from the incision without difficulty using the standard movements. The remainder of the infant delivered and the nose and mouth bulb suctioned with the cord clamped and cut as well. The infant was handed off to NICU. Cord gas obtained. The placenta was then  spontaneously expressed from the uterus and the uterus cleared of all clots and debris with moist lap sponge.  Placenta noted as in findings.  The uterine incision was inspected with concern for extension on right apex. Dr Nehemiah Settle called to assist at this time as fetal station was quite low and there was increased concerned for bladder injury via incorporation into the hysterotomy repair. The posterior lip of the hysterotomy was identified and grasped with ring forceps. The Hysterotomy was then repaired in 2 layers the first layer was a running locked layer of 0-vicryl and the second an imbricating layer of the same suture. Of note, 2.5cm extension noted at right apex which was first closed with 0-vicryl running locked before becoming incorporated with imbricating second layer. The tubes and ovaries were inspected and the gutters cleared of all clots and debris. The uterine incision was inspected and found to be hemostatic. All instruments and sponges were then removed from the abdomen. The peritoneum was then reapproximated using 2-0 vicryl suture. The fascia was then closed with 0 Vicryl in a running fashion. The skin was closed with a subcuticular stitch of 4-0 Vicryl on a Keith needle and then reinforced with Dermabond and a Honeycomb dressing. At the conclusion of the procedure all instruments and sponge counts were correct. Patient was taken to the recovery room in good condition with her baby accompanying her.   Of note, after speaking to patient's husband, patient's mother reportedly also had a "narrow pelvis" with patient and had long second stage labor before requiring a stat section.

## 2019-03-27 NOTE — Transfer of Care (Signed)
Immediate Anesthesia Transfer of Care Note  Patient: Jo [name]  Procedure(s) Performed: CESAREAN SECTION (N/A )  Patient Location: PACU  Anesthesia Type:Epidural  Level of Consciousness: awake, alert  and oriented  Airway & Oxygen Therapy: Patient Spontanous Breathing  Post-op Assessment: Report given to RN and Post -op Vital signs reviewed and stable  Post vital signs: Reviewed and stable  Last Vitals:  Vitals Value Taken Time  BP 119/68 03/27/19 0305  Temp    Pulse 103 03/27/19 0306  Resp 13 03/27/19 0306  SpO2 100 % 03/27/19 0306  Vitals shown include unvalidated device data.  Last Pain:  Vitals:   03/26/19 2230  TempSrc: Oral  PainSc:       Patients Stated Pain Goal: 5 (93/81/01 7510)  Complications: No apparent anesthesia complications

## 2019-03-27 NOTE — Lactation Note (Signed)
This note was copied from a baby's chart. Lactation Consultation Note  Patient Name: Jo [name] CVELF'Y Date: 03/27/2019 Reason for consult: Initial assessment;Term;Primapara;1st time breastfeeding  53 hours old FT female who is being mostly BF by his mother, she's a P1. Mom reported (+) breast changes during the pregnancy, and she's already familiar with hand expression her RN Darleene Cleaver has been helping her and they've spoon fed baby already. Mom called out lactation due to a difficult latch, this is a C/S baby explained to mom about the first 24 hours behavior. She has a DEBP at home that she got through her insurance.  Mom and baby doing STS when entering the room, offered assistance with latch and mom agreed to wake baby up to feed. LC assisted mom with hand expression and she was able to get several drops of colostrum very easily. LC took baby STS to mother's left breast and he was able to latch after a few attempts.  LC had to do some suck training after waking baby up, he was crying and opened his mouth really wide, some repositioning and flanging of the lips was required, but baby fed for a total of 12 minutes with a few audible swallows noted, she also stated the this particular feeding felt comfortable, mom very pleased, praised her for her efforts.  Baby fell asleep because LC had to cover him with the blanket as soon as mom told LC that he just had his bath; he probably got too warm and comfortable and fell asleep at the breast. LC resumed STS with mom/baby as soon as this feeding session was over. Reviewed normal newborn behavior, feeding cues, cluster feeding, tips for a deep latch and prevention/treatment for sore nipples.  Feeding plan:  1. Encouraged mom to feed baby STS 8-12 times/24 hours or sooner if feeding cues are present 2. Hand expression and spoon feeding were also encouraged  BF brochure, BF resources and feeding diary were reviewed. Dad present but asleep on the  couch. Mom reported all questions and concerns were answered, she's aware of [facility] OP services and will call PRN.  Maternal Data Formula Feeding for Exclusion: No Has patient been taught Hand Expression?: Yes Does the patient have breastfeeding experience prior to this delivery?: No  Feeding Feeding Type: Breast Fed  LATCH Score Latch: Repeated attempts needed to sustain latch, nipple held in mouth throughout feeding, stimulation needed to elicit sucking reflex.  Audible Swallowing: A few with stimulation  Type of Nipple: Everted at rest and after stimulation  Comfort (Breast/Nipple): Soft / non-tender  Hold (Positioning): Assistance needed to correctly position infant at breast and maintain latch.  LATCH Score: 7  Interventions Interventions: Breast feeding basics reviewed;Assisted with latch;Skin to skin;Breast massage;Hand express;Breast compression;Adjust position;Support pillows  Lactation Tools Discussed/Used WIC Program: No   Consult Status Consult Status: Follow-up Date: 03/28/19 Follow-up type: In-patient    Jo [name] 03/27/2019, 3:13 PM

## 2019-03-27 NOTE — Progress Notes (Signed)
*  LATE ENTRY 2/2 STAT SECTION*  Called to bedside at 0120 for patient's desire to push and fHR decels. Repetitive variable decels with baseline dropping form 100 > 90. CE complete, +1. Poor maternal effort despite coaching, repetitive variables persisted. Patient desired continued attempts at vaginal delivery, therefore RBA for a vacuum was reviewed including hemorrhage beneath fetal scalp, laceration or injury to fetal facial nerve, hemorrhage behind fetal eyes. Patient amenable.  Mity Vac Kiwi placed at 0130, clear of vaginal tissue. A total of 3 pulls and 3 pop-offs occurred; NICU present prior to first pull. Throughout contractions, poor maternal effort noted likely from anxiety as epidural seemed to be controlling pain. After 3 pop-offs, failed vacuum delivery called. Fetal caput still at +1. Stat section called at this time. Please see operative note for following details.

## 2019-03-28 NOTE — Lactation Note (Signed)
This note was copied from a baby's chart. Lactation Consultation Note  Patient Name: Jo [name] [id number] Date: 03/28/2019 Reason for consult: Follow-up assessment;Term;Primapara;1st time breastfeeding  P1 mother whose infant is now 30 hours old.  Baby was asleep in the bassinet when I arrived.  Both parents were attempting to nap.  Mother has given formula the last five feedings.  Questioned mother about her feedings and offered to help.  Mother was laying on her side and stated that she was sore.  Offered the option of setting up the DEBP to help stimulate breasts and increase milk supply.  Suggested that pumping will help with supply and she could feed back her EBM instead of using formula as a supplement after breast feeding.  Mother appreciated this idea but was tired.  I offered to put up a "quiet sign" and to return after her nap to intiate the DEBP.  Mother agreeable.  RN updated and she voiced that she would help me set up the pump after mother's nap.  Mother will call for latch assistance as needed.   Maternal Data    Feeding Feeding Type: Bottle Fed - Formula Nipple Type: Slow - flow  LATCH Score                   Interventions    Lactation Tools Discussed/Used     Consult Status Consult Status: Follow-up Date: 03/29/19 Follow-up type: In-patient    Little Ishikawa 03/28/2019, 3:51 PM

## 2019-03-28 NOTE — Progress Notes (Signed)
Subjective: Postpartum Day #1: Cesarean Delivery Patient reports incisional pain, tolerating PO and no problems voiding.    Objective: Vital signs in last 24 hours: Temp:  [98 F (36.7 C)-98.3 F (36.8 C)] 98 F (36.7 C) (11/25 2101) Pulse Rate:  [72-76] 72 (11/26 0606) Resp:  [18-20] 20 (11/26 0606) BP: (113-124)/(76-83) 116/83 (11/26 0606) SpO2:  [97 %-100 %] 98 % (11/26 0606)  Physical Exam:  General: alert Lochia: appropriate Uterine Fundus: firm Incision: dressing C/D/I   Recent Labs    03/26/19 1233 03/27/19 0527  HGB 11.5* 9.1*  HCT 34.5* 27.3*    Assessment/Plan: Status post Cesarean section. Doing well postoperatively.  Continue current care, ambulate.  Blane Ohara Rylend Pietrzak 03/28/2019, 8:47 AM

## 2019-03-29 ENCOUNTER — Inpatient Hospital Stay (HOSPITAL_COMMUNITY): Payer: 59

## 2019-03-29 LAB — SURGICAL PATHOLOGY

## 2019-03-29 MED ORDER — OXYCODONE HCL 5 MG PO TABS: 5.0000 mg | ORAL_TABLET | ORAL | 0 refills | Status: DC | PRN

## 2019-03-29 MED ORDER — IBUPROFEN 800 MG PO TABS: 800.0000 mg | ORAL_TABLET | Freq: Three times a day (TID) | ORAL | 0 refills | Status: DC | PRN

## 2019-03-29 NOTE — Discharge Instructions (Signed)
As per discharge pamphlet °

## 2019-03-29 NOTE — Progress Notes (Signed)
POD #2 LTCS Doing well, wants to go home Afeb, VSS Abd- soft, fundus firm, incision intact D/c home 

## 2019-03-29 NOTE — Discharge Summary (Signed)
OB Discharge Summary     Patient Name: Jo [name] DOB: 01-19-1991 MRN: [id number]  Date of admission: 03/26/2019 Delivering MD: Eula Flax M   Date of discharge: 03/29/2019  Admitting diagnosis: Pregnancy Intrauterine pregnancy: [redacted]w[redacted]d     Secondary diagnosis:  Active Problems:   [redacted] weeks gestation of pregnancy      Discharge diagnosis: Term Pregnancy Delivered and arrest of descent                                                                                                Hospital course:  Onset of Labor With Unplanned C/S  28 y.o. yo G1P1001 at [redacted]w[redacted]d was admitted in Latent Labor with PROM on 03/26/2019. Patient had a labor course significant for FHR decels once complete, suboptimal pushing effort. Membrane Rupture Time/Date: 5:00 AM ,03/26/2019   The patient went for cesarean section due to Arrest of Descent and FHR decels, and delivered a Viable infant,03/27/2019  Details of operation can be found in separate operative note. Patient had an uncomplicated postpartum course.  She is ambulating,tolerating a regular diet, passing flatus, and urinating well.  Patient is discharged home in stable condition 03/29/19.  Physical exam  Vitals:   03/28/19 0606 03/28/19 1310 03/28/19 2207 03/29/19 0541  BP: 116/83 109/81 108/73 129/78  Pulse: 72 86 85 68  Resp: 20 20 18 18   Temp:  98.3 F (36.8 C) 98 F (36.7 C) 97.8 F (36.6 C)  TempSrc:  Oral Oral Oral  SpO2: 98%     Weight:      Height:       General: alert Lochia: appropriate Uterine Fundus: firm Incision: Healing well with no significant drainage  Labs: Lab Results  Component Value Date   WBC 19.1 (H) 03/27/2019   HGB 9.1 (L) 03/27/2019   HCT 27.3 (L) 03/27/2019   MCV 93.2 03/27/2019   PLT 244 03/27/2019   CMP Latest Ref Rng & Units 03/26/2019  Glucose 70 - 99 mg/dL 99  BUN 6 - 20 mg/dL 6  Creatinine 0.44 - 1.00 mg/dL 0.44  Sodium 135 - 145 mmol/L 138  Potassium 3.5 - 5.1 mmol/L 3.4(L)  Chloride 98  - 111 mmol/L 106  CO2 22 - 32 mmol/L 23  Calcium 8.9 - 10.3 mg/dL 8.8(L)  Total Protein 6.5 - 8.1 g/dL 6.1(L)  Total Bilirubin 0.3 - 1.2 mg/dL 0.6  Alkaline Phos 38 - 126 U/L 109  AST 15 - 41 U/L 20  ALT 0 - 44 U/L 12    Discharge instruction: per After Visit Summary and "Baby and Me Booklet".  After visit meds:  Allergies as of 03/29/2019   No Known Allergies     Medication List    STOP taking these medications   pantoprazole 40 MG tablet Commonly known as: PROTONIX     TAKE these medications   ibuprofen 800 MG tablet Commonly known as: ADVIL Take 1 tablet (800 mg total) by mouth every 8 (eight) hours as needed.   oxyCODONE 5 MG immediate release tablet Commonly known as: Oxy IR/ROXICODONE Take 1 tablet (5 mg total) by  mouth every 4 (four) hours as needed for severe pain.   prenatal multivitamin Tabs tablet Take 1 tablet by mouth daily at 12 noon.       Diet: routine diet  Activity: Advance as tolerated. Pelvic rest for 6 weeks.   Outpatient follow up:2 weeks  Newborn Data: Live born female  Birth Weight: 7 lb 12.2 oz (3521 g) APGAR: 8, 9  Newborn Delivery   Birth date/time: 03/27/2019 01:57:00 Delivery type: C-Section, Low Transverse Trial of labor: Yes C-section categorization: Primary      Baby Feeding: Bottle and Breast Disposition:home with mother   03/29/2019 Zenaida Niece, MD

## 2019-03-29 NOTE — Lactation Note (Signed)
This note was copied from a baby's chart. Lactation Consultation Note: Staff nurse reports that mother is only bottle feeding today. She reports that she will ask mother if she plans to breastfeed and wants to see Rives.  Patient Name: Jo [name] [id number] Date: 03/29/2019     Maternal Data    Feeding Feeding Type: Breast Milk with Formula added Nipple Type: Slow - flow  LATCH Score                   Interventions    Lactation Tools Discussed/Used     Consult Status      Darla Lesches 03/29/2019, 3:18 PM

## 2020-02-12 LAB — OB RESULTS CONSOLE GC/CHLAMYDIA
Chlamydia: NEGATIVE
Gonorrhea: NEGATIVE

## 2020-02-12 LAB — OB RESULTS CONSOLE RPR: RPR: NONREACTIVE

## 2020-02-12 LAB — OB RESULTS CONSOLE HEPATITIS B SURFACE ANTIGEN: Hepatitis B Surface Ag: NEGATIVE

## 2020-02-12 LAB — OB RESULTS CONSOLE HIV ANTIBODY (ROUTINE TESTING): HIV: NONREACTIVE

## 2020-02-12 LAB — OB RESULTS CONSOLE RUBELLA ANTIBODY, IGM: Rubella: IMMUNE

## 2020-02-12 LAB — OB RESULTS CONSOLE ANTIBODY SCREEN: Antibody Screen: NEGATIVE

## 2020-02-12 LAB — OB RESULTS CONSOLE ABO/RH: RH Type: POSITIVE

## 2020-05-12 ENCOUNTER — Other Ambulatory Visit: Payer: Self-pay

## 2020-05-12 ENCOUNTER — Inpatient Hospital Stay (HOSPITAL_COMMUNITY)
Admission: AD | Admit: 2020-05-12 | Discharge: 2020-05-13 | Disposition: A | Discharge status: Home / Self Care | Payer: No Typology Code available for payment source | Attending: Obstetrics and Gynecology | Admitting: Obstetrics and Gynecology

## 2020-05-12 DIAGNOSIS — N858 Other specified noninflammatory disorders of uterus: Secondary | ICD-10-CM | POA: Diagnosis not present

## 2020-05-12 DIAGNOSIS — R109 Unspecified abdominal pain: Secondary | ICD-10-CM

## 2020-05-12 DIAGNOSIS — O26892 Other specified pregnancy related conditions, second trimester: Secondary | ICD-10-CM | POA: Diagnosis not present

## 2020-05-12 DIAGNOSIS — R102 Pelvic and perineal pain: Secondary | ICD-10-CM | POA: Diagnosis not present

## 2020-05-12 DIAGNOSIS — Z3A23 23 weeks gestation of pregnancy: Secondary | ICD-10-CM | POA: Insufficient documentation

## 2020-05-12 DIAGNOSIS — O2342 Unspecified infection of urinary tract in pregnancy, second trimester: Secondary | ICD-10-CM

## 2020-05-12 DIAGNOSIS — Z3A40 40 weeks gestation of pregnancy: Secondary | ICD-10-CM

## 2020-05-12 DIAGNOSIS — O26899 Other specified pregnancy related conditions, unspecified trimester: Secondary | ICD-10-CM

## 2020-05-12 LAB — URINALYSIS, ROUTINE W REFLEX MICROSCOPIC
Bilirubin Urine: NEGATIVE
Glucose, UA: NEGATIVE mg/dL
Ketones, ur: NEGATIVE mg/dL
Nitrite: NEGATIVE
Protein, ur: 30 mg/dL — AB
RBC / HPF: >50 RBC/hpf — ABNORMAL HIGH (ref 0–5)
Specific Gravity, Urine: 1.009 (ref 1.005–1.030)
pH: 6 (ref 5.0–8.0)

## 2020-05-12 MED ORDER — IBUPROFEN 600 MG PO TABS
600.0000 mg | ORAL_TABLET | Freq: Once | ORAL | Status: AC
  Administered 2020-05-12: 600 mg via ORAL
  Filled 2020-05-12: qty 1

## 2020-05-12 NOTE — MAU Note (Signed)
..  Jo [name] is a 30 y.o. at [redacted]w[redacted]d here in MAU reporting: Intermittent lower abdominal pain that began around 1830. Patient describes the feeling as period-like. Denies vaginal bleeding or leaking of fluid. +FM.  Pain score: 7/10 Vitals:   05/12/20 2131  BP: 128/77  Pulse: 88  Resp: 19  Temp: 98.3 F (36.8 C)  SpO2: 100%     FHT:  135 doppler Lab orders placed from triage: UA

## 2020-05-12 NOTE — MAU Provider Note (Signed)
Chief Complaint:  No chief complaint on file.   Event Date/Time   First Provider Initiated Contact with Patient 05/12/20 2229     HPI: Jo [name] is a 30 y.o. G2P1001 at 20w3dwho presents to maternity admissions reporting intermittent lower pelvic cramping.  Started this evening.  Feels like menstrual cramping.  . She reports good fetal movement, denies LOF, vaginal bleeding, vaginal itching/burning, urinary symptoms, h/a, dizziness, n/v, diarrhea, constipation or fever/chills.    Abdominal Pain This is a new problem. The current episode started today. The onset quality is gradual. The problem occurs intermittently. The problem has been unchanged. The pain is located in the LLQ, RLQ and suprapubic region. The pain is mild. The quality of the pain is cramping. The abdominal pain does not radiate. Pertinent negatives include no constipation, diarrhea, dysuria, fever, frequency, headaches, myalgias, nausea or vomiting. Nothing aggravates the pain. The pain is relieved by nothing. She has tried nothing for the symptoms.    RN Note: Jo [name] is a 30 y.o. at [redacted]w[redacted]d here in MAU reporting: Intermittent lower abdominal pain that began around 1830. Patient describes the feeling as period-like. Denies vaginal bleeding or leaking of fluid. +FM.  Pain score: 7/10  Past Medical History: Past Medical History:  Diagnosis Date  . GERD (gastroesophageal reflux disease)     Past obstetric history: OB History  Gravida Para Term Preterm AB Living  2 1 1     1   SAB IAB Ectopic Multiple Live Births        0 1    # Outcome Date GA Lbr Len/2nd Weight Sex Delivery Anes PTL Lv  2 Current           1 Term 03/27/19 [redacted]w[redacted]d 02:42 / 00:57 3521 g M CS-LTranv EPI  LIV    Past Surgical History: Past Surgical History:  Procedure Laterality Date  . CESAREAN SECTION N/A 03/27/2019   Procedure: CESAREAN SECTION;  Surgeon: 03/29/2019, MD;  Location: [facility] LD ORS;  Service: Obstetrics;  Laterality: N/A;     Family History: Family History  Problem Relation Age of Onset  . Hypertension Other     Social History: Social History   Tobacco Use  . Smoking status: Never Smoker  . Smokeless tobacco: Never Used  Vaping Use  . Vaping Use: Never used  Substance Use Topics  . Alcohol use: Not Currently  . Drug use: Not Currently    Allergies: No Known Allergies  Meds:  Medications Prior to Admission  Medication Sig Dispense Refill Last Dose  . Prenatal Vit-Fe Fumarate-FA (PRENATAL MULTIVITAMIN) TABS tablet Take 1 tablet by mouth daily at 12 noon.   05/11/2020 at Unknown time  . ibuprofen (ADVIL) 800 MG tablet Take 1 tablet (800 mg total) by mouth every 8 (eight) hours as needed. 30 tablet 0   . oxyCODONE (OXY IR/ROXICODONE) 5 MG immediate release tablet Take 1 tablet (5 mg total) by mouth every 4 (four) hours as needed for severe pain. 15 tablet 0     I have reviewed patient's Past Medical Hx, Surgical Hx, Family Hx, Social Hx, medications and allergies.   ROS:  Review of Systems  Constitutional: Negative for fever.  Gastrointestinal: Positive for abdominal pain. Negative for constipation, diarrhea, nausea and vomiting.  Genitourinary: Negative for dysuria and frequency.  Musculoskeletal: Negative for myalgias.  Neurological: Negative for headaches.   Other systems negative  Physical Exam   Patient Vitals for the past 24 hrs:  BP Temp Temp src  Pulse Resp SpO2 Height Weight  05/12/20 2159 128/78 - - 85 - 100 % - -  05/12/20 2131 128/77 98.3 F (36.8 C) Oral 88 19 100 % - -  05/12/20 2127 - - - - - - 5\' 7"  (1.702 m) 60.7 kg   Constitutional: Well-developed, well-nourished female in no acute distress.  Cardiovascular: normal rate and rhythm Respiratory: normal effort, clear to auscultation bilaterally GI: Abd soft, non-tender, gravid appropriate for gestational age.   No rebound or guarding. MS: Extremities nontender, no edema, normal ROM Neurologic: Alert and oriented x 4.   GU: Neg CVAT.  PELVIC EXAM:  Cervix closed and long, ballotable.   FHT:  Baseline 130 , moderate variability, accelerations present, no decelerations Contractions: Uterine irritability noted with 5-10 second cramps   Labs: Results for orders placed or performed during the hospital encounter of 05/12/20 (from the past 24 hour(s))  Urinalysis, Routine w reflex microscopic Urine, Clean Catch     Status: Abnormal   Collection Time: 05/12/20  9:35 PM  Result Value Ref Range   Color, Urine YELLOW YELLOW   APPearance HAZY (A) CLEAR   Specific Gravity, Urine 1.009 1.005 - 1.030   pH 6.0 5.0 - 8.0   Glucose, UA NEGATIVE NEGATIVE mg/dL   Hgb urine dipstick LARGE (A) NEGATIVE   Bilirubin Urine NEGATIVE NEGATIVE   Ketones, ur NEGATIVE NEGATIVE mg/dL   Protein, ur 30 (A) NEGATIVE mg/dL   Nitrite NEGATIVE NEGATIVE   Leukocytes,Ua TRACE (A) NEGATIVE   RBC / HPF >50 (H) 0 - 5 RBC/hpf   WBC, UA 6-10 0 - 5 WBC/hpf   Bacteria, UA FEW (A) NONE SEEN   Squamous Epithelial / LPF 0-5 0 - 5  CBC     Status: Abnormal   Collection Time: 05/12/20 11:08 PM  Result Value Ref Range   WBC 10.1 4.0 - 10.5 K/uL   RBC 3.36 (L) 3.87 - 5.11 MIL/uL   Hemoglobin 10.7 (L) 12.0 - 15.0 g/dL   HCT 07/10/20 (L) 30.1 - 60.1 %   MCV 92.3 80.0 - 100.0 fL   MCH 31.8 26.0 - 34.0 pg   MCHC 34.5 30.0 - 36.0 g/dL   RDW 09.3 23.5 - 57.3 %   Platelets 236 150 - 400 K/uL   nRBC 0.0 0.0 - 0.2 %  Basic metabolic panel     Status: Abnormal   Collection Time: 05/12/20 11:08 PM  Result Value Ref Range   Sodium 135 135 - 145 mmol/L   Potassium 3.5 3.5 - 5.1 mmol/L   Chloride 105 98 - 111 mmol/L   CO2 22 22 - 32 mmol/L   Glucose, Bld 91 70 - 99 mg/dL   BUN 6 6 - 20 mg/dL   Creatinine, Ser 07/10/20 0.44 - 1.00 mg/dL   Calcium 8.6 (L) 8.9 - 10.3 mg/dL   GFR, Estimated 2.54 >27 mL/min   Anion gap 8 5 - 15    Imaging:  No results found.  MAU Course/MDM: I have ordered labs and reviewed results. Discussed this seems to be uterine  cramping perhaps related to Urinary Tract infection.  Urine sent for culture but UA has features of infection.  Ran a CBC and BMET to double check there is no gross leukocytosis indicating more serious infection.  These are normal  NST reviewed, reassuring for gestational age.  Treatments in MAU included Ibuprofen which almost completely improved pain. Discussed will Rx antibiotic until culture is back  Pt has an appointment in AM at  office..    Assessment: Single IUP at [redacted]w[redacted]d Uterine irritability Leukocytes, blood, protein in urine, likely UTI  Plan: Discharge home Rx Duricef for presumed UTI Preterm Labor precautions and fetal kick counts Follow up in Office for prenatal visit in AM Follow urine culture Encouraged to return if she develops worsening of symptoms, increase in pain, fever, or other concerning symptoms.  Pt stable at time of discharge.  Wynelle Bourgeois CNM, MSN Certified Nurse-Midwife 05/12/2020 10:29 PM

## 2020-05-13 LAB — CBC
HCT: 31 % — ABNORMAL LOW (ref 36.0–46.0)
Hemoglobin: 10.7 g/dL — ABNORMAL LOW (ref 12.0–15.0)
MCH: 31.8 pg (ref 26.0–34.0)
MCHC: 34.5 g/dL (ref 30.0–36.0)
MCV: 92.3 fL (ref 80.0–100.0)
Platelets: 236 10*3/uL (ref 150–400)
RBC: 3.36 MIL/uL — ABNORMAL LOW (ref 3.87–5.11)
RDW: 12.2 % (ref 11.5–15.5)
WBC: 10.1 10*3/uL (ref 4.0–10.5)
nRBC: 0 % (ref 0.0–0.2)

## 2020-05-13 LAB — BASIC METABOLIC PANEL
Anion gap: 8 (ref 5–15)
BUN: 6 mg/dL (ref 6–20)
CO2: 22 mmol/L (ref 22–32)
Calcium: 8.6 mg/dL — ABNORMAL LOW (ref 8.9–10.3)
Chloride: 105 mmol/L (ref 98–111)
Creatinine, Ser: 0.5 mg/dL (ref 0.44–1.00)
GFR, Estimated: >60 mL/min (ref >60)
Glucose, Bld: 91 mg/dL (ref 70–99)
Potassium: 3.5 mmol/L (ref 3.5–5.1)
Sodium: 135 mmol/L (ref 135–145)

## 2020-05-13 MED ORDER — CEFADROXIL 500 MG PO CAPS: 500.0000 mg | ORAL_CAPSULE | Freq: Two times a day (BID) | ORAL | 0 refills | Status: DC

## 2020-05-13 NOTE — Discharge Instructions (Signed)
Pregnancy and Urinary Tract Infection  A urinary tract infection (UTI) is an infection of any part of the urinary tract. This includes the kidneys, the tubes that connect your kidneys to your bladder (ureters), the bladder, and the tube that carries urine out of your body (urethra). These organs make, store, and get rid of urine in the body. Your health care provider may use other names to describe the infection. An upper UTI affects the ureters and kidneys (pyelonephritis). A lower UTI affects the bladder (cystitis) and urethra (urethritis). Most urinary tract infections are caused by bacteria in your genital area, around the entrance to your urinary tract (urethra). These bacteria grow and cause irritation and inflammation of your urinary tract. You are more likely to develop a UTI during pregnancy because the physical and hormonal changes your body goes through can make it easier for bacteria to get into your urinary tract. Your growing baby also puts pressure on your bladder and can affect urine flow. It is important to recognize and treat UTIs in pregnancy because of the risk of serious complications for both you and your baby. How does this affect me? Symptoms of a UTI include:  Needing to urinate right away (urgently).  Frequent urination or passing small amounts of urine frequently.  Pain or burning with urination.  Blood in the urine.  Urine that smells bad or unusual.  Trouble urinating.  Cloudy urine.  Pain in the abdomen or lower back.  Vaginal discharge. You may also have:  Vomiting or a decreased appetite.  Confusion.  Irritability or tiredness.  A fever.  Diarrhea. How does this affect my baby? An untreated UTI during pregnancy could lead to a kidney infection or a systemic infection, which can cause health problems that could affect your baby. Possible complications of an untreated UTI include:  Giving birth to your baby before 37 weeks of pregnancy  (premature).  Having a baby with a low birth weight.  Developing high blood pressure during pregnancy (preeclampsia).  Having a low hemoglobin level (anemia). What can I do to lower my risk? To prevent a UTI:  Go to the bathroom as soon as you feel the need. Do not hold urine for long periods of time.  Always wipe from front to back, especially after a bowel movement. Use each tissue one time when you wipe.  Empty your bladder after sex.  Keep your genital area dry.  Drink 6-10 glasses of water each day.  Do not douche or use deodorant sprays. How is this treated? Treatment for this condition may include:  Antibiotic medicines that are safe to take during pregnancy.  Other medicines to treat less common causes of UTI. Follow these instructions at home:  If you were prescribed an antibiotic medicine, take it as told by your health care provider. Do not stop using the antibiotic even if you start to feel better.  Keep all follow-up visits as told by your health care provider. This is important. Contact a health care provider if:  Your symptoms do not improve or they get worse.  You have abnormal vaginal discharge. Get help right away if you:  Have a fever.  Have nausea and vomiting.  Have back or side pain.  Feel contractions in your uterus.  Have lower belly pain.  Have a gush of fluid from your vagina.  Have blood in your urine. Summary  A urinary tract infection (UTI) is an infection of any part of the urinary tract, which includes the   kidneys, ureters, bladder, and urethra.  Most urinary tract infections are caused by bacteria in your genital area, around the entrance to your urinary tract (urethra).  You are more likely to develop a UTI during pregnancy.  If you were prescribed an antibiotic medicine, take it as told by your health care provider. Do not stop using the antibiotic even if you start to feel better. This information is not intended to  replace advice given to you by your health care provider. Make sure you discuss any questions you have with your health care provider. Document Revised: 08/10/2018 Document Reviewed: 03/22/2018 Elsevier Patient Education  2021 [employer]  

## 2020-05-14 LAB — CULTURE, OB URINE: Culture: NO GROWTH

## 2020-08-18 ENCOUNTER — Telehealth (HOSPITAL_COMMUNITY): Payer: Self-pay | Admitting: *Deleted

## 2020-08-18 NOTE — Patient Instructions (Signed)
ETTIE KRONTZ  08/18/2020   Your procedure is scheduled on:  08/31/2020  Arrive at 0800 at Entrance C on [address] at [facility]  and [address]. You are invited to use the FREE valet parking or use the Visitor's parking deck.  Pick up the phone at the desk and dial [phone].  Call this number if you have problems the morning of surgery: [phone]  Remember:   Do not eat food:(After Midnight) Desps de medianoche.  Do not drink clear liquids: (After Midnight) Desps de medianoche.  Take these medicines the morning of surgery with A SIP OF WATER:  none   Do not wear jewelry, make-up or nail polish.  Do not wear lotions, powders, or perfumes. Do not wear deodorant.  Do not shave 48 hours prior to surgery.  Do not bring valuables to the hospital.  [facility] is not   responsible for any belongings or valuables brought to the hospital.  Contacts, dentures or bridgework may not be worn into surgery.  Leave suitcase in the car. After surgery it may be brought to your room.  For patients admitted to the hospital, checkout time is 11:00 AM the day of              discharge.      Please read over the following fact sheets that you were given:     Preparing for Surgery

## 2020-08-18 NOTE — Telephone Encounter (Signed)
Preadmission screen  

## 2020-08-19 ENCOUNTER — Telehealth (HOSPITAL_COMMUNITY): Payer: Self-pay | Admitting: *Deleted

## 2020-08-19 NOTE — Telephone Encounter (Signed)
Preadmission screen  

## 2020-08-20 ENCOUNTER — Encounter (HOSPITAL_COMMUNITY): Payer: Self-pay

## 2020-08-27 ENCOUNTER — Other Ambulatory Visit (HOSPITAL_COMMUNITY): Payer: No Typology Code available for payment source

## 2020-08-28 ENCOUNTER — Encounter (HOSPITAL_COMMUNITY)
Admission: RE | Admit: 2020-08-28 | Discharge: 2020-08-28 | Disposition: A | Discharge status: Home / Self Care | Payer: No Typology Code available for payment source | Source: Ambulatory Visit | Attending: Obstetrics and Gynecology | Admitting: Obstetrics and Gynecology

## 2020-08-28 ENCOUNTER — Other Ambulatory Visit: Payer: Self-pay

## 2020-08-28 ENCOUNTER — Other Ambulatory Visit (HOSPITAL_COMMUNITY)
Admission: RE | Admit: 2020-08-28 | Discharge: 2020-08-28 | Disposition: A | Discharge status: Home / Self Care | Payer: No Typology Code available for payment source | Source: Ambulatory Visit | Attending: Obstetrics and Gynecology | Admitting: Obstetrics and Gynecology

## 2020-08-28 DIAGNOSIS — Z01812 Encounter for preprocedural laboratory examination: Secondary | ICD-10-CM | POA: Insufficient documentation

## 2020-08-28 DIAGNOSIS — Z20822 Contact with and (suspected) exposure to covid-19: Secondary | ICD-10-CM | POA: Insufficient documentation

## 2020-08-28 LAB — CBC
HCT: 30.9 % — ABNORMAL LOW (ref 36.0–46.0)
Hemoglobin: 9.6 g/dL — ABNORMAL LOW (ref 12.0–15.0)
MCH: 27.1 pg (ref 26.0–34.0)
MCHC: 31.1 g/dL (ref 30.0–36.0)
MCV: 87.3 fL (ref 80.0–100.0)
Platelets: 259 10*3/uL (ref 150–400)
RBC: 3.54 MIL/uL — ABNORMAL LOW (ref 3.87–5.11)
RDW: 14 % (ref 11.5–15.5)
WBC: 9.5 10*3/uL (ref 4.0–10.5)
nRBC: 0 % (ref 0.0–0.2)

## 2020-08-28 LAB — TYPE AND SCREEN
ABO/RH(D): B POS
Antibody Screen: NEGATIVE

## 2020-08-28 NOTE — Anesthesia Preprocedure Evaluation (Addendum)
Anesthesia Evaluation  Patient identified by MRN, date of birth, ID band Patient awake    Reviewed: Allergy & Precautions, NPO status , Patient's Chart, lab work & pertinent test results  Airway Mallampati: II  TM Distance: >3 FB Neck ROM: Full    Dental no notable dental hx.    Pulmonary neg pulmonary ROS,    Pulmonary exam normal breath sounds clear to auscultation       Cardiovascular negative cardio ROS Normal cardiovascular exam Rhythm:Regular Rate:Normal     Neuro/Psych negative neurological ROS  negative psych ROS   GI/Hepatic Neg liver ROS, GERD  Controlled,  Endo/Other  negative endocrine ROS  Renal/GU negative Renal ROS  negative genitourinary   Musculoskeletal negative musculoskeletal ROS (+)   Abdominal   Peds negative pediatric ROS (+)  Hematology  (+) Blood dyscrasia, anemia , 9.6/30, plt 259   Anesthesia Other Findings   Reproductive/Obstetrics (+) Pregnancy Repeat c section, 1 prior in 2020 under epidural                            Anesthesia Physical Anesthesia Plan  ASA: II  Anesthesia Plan: Spinal   Post-op Pain Management:    Induction:   PONV Risk Score and Plan: 3 and Ondansetron, Dexamethasone and Treatment may vary due to age or medical condition  Airway Management Planned: Natural Airway and Nasal Cannula  Additional Equipment: None  Intra-op Plan:   Post-operative Plan:   Informed Consent: I have reviewed the patients History and Physical, chart, labs and discussed the procedure including the risks, benefits and alternatives for the proposed anesthesia with the patient or authorized representative who has indicated his/her understanding and acceptance.       Plan Discussed with: CRNA  Anesthesia Plan Comments:        Anesthesia Quick Evaluation

## 2020-08-29 LAB — RPR: RPR Ser Ql: NONREACTIVE

## 2020-08-29 LAB — SARS CORONAVIRUS 2 (TAT 6-24 HRS): SARS Coronavirus 2: NEGATIVE

## 2020-08-30 ENCOUNTER — Encounter (HOSPITAL_COMMUNITY): Payer: Self-pay | Admitting: Obstetrics and Gynecology

## 2020-08-30 NOTE — H&P (Signed)
Jo [name] is a 30 y.o. female, G2 P1001, EGA 39+ weeks with EDC 5-7 presenting for repeat c-section.  Previous LTCS, declines VBAC, PNC uncomplicated.  OB History    Gravida  2   Para  1   Term  1   Preterm      AB      Living  1     SAB      IAB      Ectopic      Multiple  0   Live Births  1          Past Medical History:  Diagnosis Date  . GERD (gastroesophageal reflux disease)    Past Surgical History:  Procedure Laterality Date  . CESAREAN SECTION N/A 03/27/2019   Procedure: CESAREAN SECTION;  Surgeon: Carlisle Cater, MD;  Location: [facility] LD ORS;  Service: Obstetrics;  Laterality: N/A;   Family History: family history includes Hypertension in an other family member. Social History:  reports that she has never smoked. She has never used smokeless tobacco. She reports previous alcohol use. She reports previous drug use.     Maternal Diabetes: No Genetic Screening: Declined Maternal Ultrasounds/Referrals: Isolated EIF (echogenic intracardiac focus) Fetal Ultrasounds or other Referrals:  None Maternal Substance Abuse:  No Significant Maternal Medications:  None Significant Maternal Lab Results:  Group B Strep negative Other Comments:  None  Review of Systems  Respiratory: Negative.   Cardiovascular: Negative.    Maternal Medical History:  Fetal activity: Perceived fetal activity is normal.    Prenatal complications: no prenatal complications     unknown if currently breastfeeding. Maternal Exam:  Abdomen: Patient reports no abdominal tenderness. Surgical scars: low transverse.   Estimated fetal weight is 7 lbs.   Fetal presentation: vertex  Introitus: Normal vulva. Normal vagina.  Amniotic fluid character: not assessed.     Physical Exam Constitutional:      Appearance: Normal appearance.  Cardiovascular:     Rate and Rhythm: Normal rate and regular rhythm.     Heart sounds: Normal heart sounds. No murmur heard.   Pulmonary:      Effort: Pulmonary effort is normal. No respiratory distress.     Breath sounds: Normal breath sounds.  Abdominal:     Palpations: Abdomen is soft.  Genitourinary:    General: Normal vulva.  Musculoskeletal:     Cervical back: Normal range of motion and neck supple.  Neurological:     Mental Status: She is alert.     Prenatal labs: ABO, Rh: --/--/B POS (04/29 1006) Antibody: NEG (04/29 1006) Rubella: Immune (10/13 0000) RPR: NON REACTIVE (04/29 1030)  HBsAg: Negative (10/13 0000)  HIV: Non-reactive (10/13 0000)  GBS:   Neg  Assessment/Plan: IUP at 39+ weeks, previous LTCS, declines VBAC.  C-section procedure and risks discussed, questions answered.  Will admit for repeat c-section   Zenaida Niece 08/30/2020, 3:34 PM

## 2020-08-31 ENCOUNTER — Encounter (HOSPITAL_COMMUNITY): Payer: Self-pay | Admitting: Obstetrics and Gynecology

## 2020-08-31 ENCOUNTER — Inpatient Hospital Stay (HOSPITAL_COMMUNITY): Payer: No Typology Code available for payment source | Admitting: Anesthesiology

## 2020-08-31 ENCOUNTER — Encounter (HOSPITAL_COMMUNITY)
Admission: RE | Disposition: A | Discharge status: Home / Self Care | Payer: Self-pay | Source: Home / Self Care | Attending: Obstetrics and Gynecology

## 2020-08-31 ENCOUNTER — Other Ambulatory Visit: Payer: Self-pay

## 2020-08-31 ENCOUNTER — Inpatient Hospital Stay (HOSPITAL_COMMUNITY)
Admission: RE | Admit: 2020-08-31 | Discharge: 2020-09-02 | DRG: 787 | Disposition: A | Discharge status: Home / Self Care | Payer: No Typology Code available for payment source | Attending: Obstetrics and Gynecology | Admitting: Obstetrics and Gynecology

## 2020-08-31 DIAGNOSIS — Z3A39 39 weeks gestation of pregnancy: Secondary | ICD-10-CM

## 2020-08-31 DIAGNOSIS — Z98891 History of uterine scar from previous surgery: Secondary | ICD-10-CM

## 2020-08-31 DIAGNOSIS — Z20822 Contact with and (suspected) exposure to covid-19: Secondary | ICD-10-CM | POA: Diagnosis present

## 2020-08-31 DIAGNOSIS — D62 Acute posthemorrhagic anemia: Secondary | ICD-10-CM | POA: Diagnosis not present

## 2020-08-31 DIAGNOSIS — O9081 Anemia of the puerperium: Secondary | ICD-10-CM | POA: Diagnosis not present

## 2020-08-31 DIAGNOSIS — O34211 Maternal care for low transverse scar from previous cesarean delivery: Principal | ICD-10-CM | POA: Diagnosis present

## 2020-08-31 SURGERY — Surgical Case
Anesthesia: Spinal

## 2020-08-31 MED ORDER — MENTHOL 3 MG MT LOZG: 1.0000 | LOZENGE | OROMUCOSAL | Status: DC | PRN

## 2020-08-31 MED ORDER — ONDANSETRON HCL 4 MG/2ML IJ SOLN: 4.0000 mg | Freq: Three times a day (TID) | INTRAMUSCULAR | Status: DC | PRN

## 2020-08-31 MED ORDER — IBUPROFEN 600 MG PO TABS
600.0000 mg | ORAL_TABLET | Freq: Four times a day (QID) | ORAL | Status: DC
  Administered 2020-09-01 – 2020-09-02 (×5): 600 mg via ORAL
  Filled 2020-08-31 (×5): qty 1

## 2020-08-31 MED ORDER — NALBUPHINE HCL 10 MG/ML IJ SOLN
5.0000 mg | INTRAMUSCULAR | Status: DC | PRN
  Administered 2020-08-31 – 2020-09-01 (×3): 5 mg via INTRAVENOUS
  Filled 2020-08-31 (×4): qty 1

## 2020-08-31 MED ORDER — OXYTOCIN-SODIUM CHLORIDE 30-0.9 UT/500ML-% IV SOLN
INTRAVENOUS | Status: AC
  Filled 2020-08-31: qty 500

## 2020-08-31 MED ORDER — OXYCODONE HCL 5 MG/5ML PO SOLN: 5.0000 mg | Freq: Once | ORAL | Status: DC | PRN

## 2020-08-31 MED ORDER — DEXAMETHASONE SODIUM PHOSPHATE 10 MG/ML IJ SOLN
INTRAMUSCULAR | Status: AC
  Filled 2020-08-31: qty 1

## 2020-08-31 MED ORDER — SCOPOLAMINE 1 MG/3DAYS TD PT72: 1.0000 | MEDICATED_PATCH | Freq: Once | TRANSDERMAL | Status: DC

## 2020-08-31 MED ORDER — HYDROMORPHONE HCL 1 MG/ML IJ SOLN
0.5000 mg | Freq: Once | INTRAMUSCULAR | Status: AC
Start: 2020-08-31 — End: 2020-08-31
  Administered 2020-08-31: 0.5 mg via INTRAVENOUS
  Filled 2020-08-31: qty 1

## 2020-08-31 MED ORDER — KETOROLAC TROMETHAMINE 30 MG/ML IJ SOLN: 30.0000 mg | Freq: Four times a day (QID) | INTRAMUSCULAR | Status: AC | PRN

## 2020-08-31 MED ORDER — SIMETHICONE 80 MG PO CHEW: 80.0000 mg | CHEWABLE_TABLET | ORAL | Status: DC | PRN

## 2020-08-31 MED ORDER — HYDROMORPHONE HCL 1 MG/ML IJ SOLN
INTRAMUSCULAR | Status: AC
  Filled 2020-08-31: qty 0.5

## 2020-08-31 MED ORDER — SODIUM CHLORIDE 0.9 % IV SOLN
6.2500 mg | Freq: Once | INTRAVENOUS | Status: AC
  Administered 2020-08-31: 6.25 mg via INTRAVENOUS
  Filled 2020-08-31: qty 0.25

## 2020-08-31 MED ORDER — OXYTOCIN-SODIUM CHLORIDE 30-0.9 UT/500ML-% IV SOLN
INTRAVENOUS | Status: DC | PRN
  Administered 2020-08-31: 300 mL via INTRAVENOUS

## 2020-08-31 MED ORDER — FENTANYL CITRATE (PF) 100 MCG/2ML IJ SOLN
INTRAMUSCULAR | Status: AC
  Filled 2020-08-31: qty 2

## 2020-08-31 MED ORDER — SODIUM CHLORIDE 0.9 % IR SOLN
Status: DC | PRN
  Administered 2020-08-31: 1

## 2020-08-31 MED ORDER — DEXAMETHASONE SODIUM PHOSPHATE 10 MG/ML IJ SOLN
INTRAMUSCULAR | Status: DC | PRN
  Administered 2020-08-31: 10 mg via INTRAVENOUS

## 2020-08-31 MED ORDER — OXYCODONE HCL 5 MG PO TABS: 5.0000 mg | ORAL_TABLET | Freq: Once | ORAL | Status: DC | PRN

## 2020-08-31 MED ORDER — NALOXONE HCL 0.4 MG/ML IJ SOLN: 0.4000 mg | INTRAMUSCULAR | Status: DC | PRN

## 2020-08-31 MED ORDER — METHYLERGONOVINE MALEATE 0.2 MG/ML IJ SOLN: 0.2000 mg | INTRAMUSCULAR | Status: DC | PRN

## 2020-08-31 MED ORDER — PHENYLEPHRINE HCL-NACL 20-0.9 MG/250ML-% IV SOLN
INTRAVENOUS | Status: AC
  Filled 2020-08-31: qty 250

## 2020-08-31 MED ORDER — SODIUM CHLORIDE 0.9% FLUSH: 3.0000 mL | INTRAVENOUS | Status: DC | PRN

## 2020-08-31 MED ORDER — POVIDONE-IODINE 10 % EX SWAB: 2.0000 "application " | Freq: Once | CUTANEOUS | Status: DC

## 2020-08-31 MED ORDER — PHENYLEPHRINE HCL-NACL 20-0.9 MG/250ML-% IV SOLN
INTRAVENOUS | Status: DC | PRN
  Administered 2020-08-31: 60 ug/min via INTRAVENOUS

## 2020-08-31 MED ORDER — LACTATED RINGERS IV SOLN: INTRAVENOUS | Status: DC

## 2020-08-31 MED ORDER — HYDROMORPHONE HCL 1 MG/ML IJ SOLN
0.2500 mg | INTRAMUSCULAR | Status: DC | PRN
  Administered 2020-08-31: 0.5 mg via INTRAVENOUS

## 2020-08-31 MED ORDER — NALBUPHINE HCL 10 MG/ML IJ SOLN: 5.0000 mg | INTRAMUSCULAR | Status: DC | PRN

## 2020-08-31 MED ORDER — NALBUPHINE HCL 10 MG/ML IJ SOLN: 5.0000 mg | Freq: Once | INTRAMUSCULAR | Status: AC | PRN

## 2020-08-31 MED ORDER — ONDANSETRON HCL 4 MG/2ML IJ SOLN
INTRAMUSCULAR | Status: AC
  Filled 2020-08-31: qty 2

## 2020-08-31 MED ORDER — ZOLPIDEM TARTRATE 5 MG PO TABS: 5.0000 mg | ORAL_TABLET | Freq: Every evening | ORAL | Status: DC | PRN

## 2020-08-31 MED ORDER — KETOROLAC TROMETHAMINE 30 MG/ML IJ SOLN
30.0000 mg | Freq: Once | INTRAMUSCULAR | Status: AC | PRN
  Administered 2020-08-31: 30 mg via INTRAVENOUS

## 2020-08-31 MED ORDER — DIBUCAINE (PERIANAL) 1 % EX OINT: 1.0000 "application " | TOPICAL_OINTMENT | CUTANEOUS | Status: DC | PRN

## 2020-08-31 MED ORDER — CEFAZOLIN SODIUM-DEXTROSE 2-4 GM/100ML-% IV SOLN
2.0000 g | INTRAVENOUS | Status: AC
  Administered 2020-08-31: 2 g via INTRAVENOUS

## 2020-08-31 MED ORDER — MEPERIDINE HCL 25 MG/ML IJ SOLN: 6.2500 mg | INTRAMUSCULAR | Status: DC | PRN

## 2020-08-31 MED ORDER — FENTANYL CITRATE (PF) 100 MCG/2ML IJ SOLN
INTRAMUSCULAR | Status: DC | PRN
  Administered 2020-08-31: 15 ug via INTRAVENOUS

## 2020-08-31 MED ORDER — DIPHENHYDRAMINE HCL 25 MG PO CAPS
25.0000 mg | ORAL_CAPSULE | ORAL | Status: DC | PRN
  Administered 2020-09-01 (×3): 25 mg via ORAL
  Filled 2020-08-31 (×3): qty 1

## 2020-08-31 MED ORDER — OXYTOCIN-SODIUM CHLORIDE 30-0.9 UT/500ML-% IV SOLN: 2.5000 [IU]/h | INTRAVENOUS | Status: AC

## 2020-08-31 MED ORDER — NALOXONE HCL 4 MG/10ML IJ SOLN
1.0000 ug/kg/h | INTRAVENOUS | Status: DC | PRN
  Filled 2020-08-31: qty 5

## 2020-08-31 MED ORDER — WITCH HAZEL-GLYCERIN EX PADS: 1.0000 "application " | MEDICATED_PAD | CUTANEOUS | Status: DC | PRN

## 2020-08-31 MED ORDER — ACETAMINOPHEN 500 MG PO TABS: 1000.0000 mg | ORAL_TABLET | Freq: Four times a day (QID) | ORAL | Status: DC

## 2020-08-31 MED ORDER — ACETAMINOPHEN 500 MG PO TABS
1000.0000 mg | ORAL_TABLET | Freq: Four times a day (QID) | ORAL | Status: DC
  Administered 2020-08-31 – 2020-09-02 (×8): 1000 mg via ORAL
  Filled 2020-08-31 (×8): qty 2

## 2020-08-31 MED ORDER — MORPHINE SULFATE (PF) 0.5 MG/ML IJ SOLN
INTRAMUSCULAR | Status: AC
  Filled 2020-08-31: qty 10

## 2020-08-31 MED ORDER — SENNOSIDES-DOCUSATE SODIUM 8.6-50 MG PO TABS
2.0000 | ORAL_TABLET | ORAL | Status: DC
  Administered 2020-09-01 – 2020-09-02 (×2): 2 via ORAL
  Filled 2020-08-31 (×2): qty 2

## 2020-08-31 MED ORDER — KETOROLAC TROMETHAMINE 30 MG/ML IJ SOLN
30.0000 mg | Freq: Four times a day (QID) | INTRAMUSCULAR | Status: AC
  Administered 2020-08-31 – 2020-09-01 (×3): 30 mg via INTRAVENOUS
  Filled 2020-08-31 (×3): qty 1

## 2020-08-31 MED ORDER — COCONUT OIL OIL: 1.0000 "application " | TOPICAL_OIL | Status: DC | PRN

## 2020-08-31 MED ORDER — MORPHINE SULFATE (PF) 0.5 MG/ML IJ SOLN
INTRAMUSCULAR | Status: DC | PRN
  Administered 2020-08-31: .15 mg via EPIDURAL

## 2020-08-31 MED ORDER — ONDANSETRON HCL 4 MG/2ML IJ SOLN
INTRAMUSCULAR | Status: DC | PRN
  Administered 2020-08-31: 4 mg via INTRAVENOUS

## 2020-08-31 MED ORDER — CEFAZOLIN SODIUM-DEXTROSE 2-4 GM/100ML-% IV SOLN
INTRAVENOUS | Status: AC
  Filled 2020-08-31: qty 100

## 2020-08-31 MED ORDER — KETOROLAC TROMETHAMINE 30 MG/ML IJ SOLN
INTRAMUSCULAR | Status: AC
  Filled 2020-08-31: qty 1

## 2020-08-31 MED ORDER — MAGNESIUM HYDROXIDE 400 MG/5ML PO SUSP: 30.0000 mL | ORAL | Status: DC | PRN

## 2020-08-31 MED ORDER — DIPHENHYDRAMINE HCL 25 MG PO CAPS: 25.0000 mg | ORAL_CAPSULE | Freq: Four times a day (QID) | ORAL | Status: DC | PRN

## 2020-08-31 MED ORDER — MEASLES, MUMPS & RUBELLA VAC IJ SOLR: 0.5000 mL | Freq: Once | INTRAMUSCULAR | Status: DC

## 2020-08-31 MED ORDER — METHYLERGONOVINE MALEATE 0.2 MG PO TABS
0.2000 mg | ORAL_TABLET | ORAL | Status: DC | PRN
Start: 2020-08-31 — End: 2020-09-02

## 2020-08-31 MED ORDER — TETANUS-DIPHTH-ACELL PERTUSSIS 5-2.5-18.5 LF-MCG/0.5 IM SUSY: 0.5000 mL | PREFILLED_SYRINGE | Freq: Once | INTRAMUSCULAR | Status: DC

## 2020-08-31 MED ORDER — PROMETHAZINE HCL 25 MG/ML IJ SOLN: 6.2500 mg | INTRAMUSCULAR | Status: DC | PRN

## 2020-08-31 MED ORDER — BUPIVACAINE IN DEXTROSE 0.75-8.25 % IT SOLN
INTRATHECAL | Status: DC | PRN
  Administered 2020-08-31: 1.6 mg via INTRATHECAL

## 2020-08-31 MED ORDER — FENTANYL CITRATE (PF) 100 MCG/2ML IJ SOLN
INTRAMUSCULAR | Status: DC | PRN
  Administered 2020-08-31 (×2): 25 ug via INTRAVENOUS

## 2020-08-31 MED ORDER — OXYCODONE HCL 5 MG PO TABS: 5.0000 mg | ORAL_TABLET | ORAL | Status: DC | PRN

## 2020-08-31 MED ORDER — DIPHENHYDRAMINE HCL 50 MG/ML IJ SOLN
12.5000 mg | INTRAMUSCULAR | Status: DC | PRN
  Administered 2020-08-31 – 2020-09-01 (×5): 12.5 mg via INTRAVENOUS
  Filled 2020-08-31 (×6): qty 1

## 2020-08-31 MED ORDER — NALBUPHINE HCL 10 MG/ML IJ SOLN
5.0000 mg | Freq: Once | INTRAMUSCULAR | Status: AC | PRN
  Administered 2020-08-31: 5 mg via INTRAVENOUS

## 2020-08-31 MED ORDER — PRENATAL MULTIVITAMIN CH
1.0000 | ORAL_TABLET | Freq: Every day | ORAL | Status: DC
  Administered 2020-09-01 – 2020-09-02 (×2): 1 via ORAL
  Filled 2020-08-31 (×2): qty 1

## 2020-08-31 SURGICAL SUPPLY — 31 items
BENZOIN TINCTURE PRP APPL 2/3 (GAUZE/BANDAGES/DRESSINGS) ×2 IMPLANT
CHLORAPREP W/TINT 26ML (MISCELLANEOUS) ×2 IMPLANT
CLAMP CORD UMBIL (MISCELLANEOUS) IMPLANT
CLOTH BEACON ORANGE TIMEOUT ST (SAFETY) ×2 IMPLANT
DRSG OPSITE POSTOP 4X10 (GAUZE/BANDAGES/DRESSINGS) ×2 IMPLANT
ELECT REM PT RETURN 9FT ADLT (ELECTROSURGICAL) ×2
ELECTRODE REM PT RTRN 9FT ADLT (ELECTROSURGICAL) ×1 IMPLANT
EXTRACTOR VACUUM KIWI (MISCELLANEOUS) IMPLANT
EXTRACTOR VACUUM M CUP 4 TUBE (SUCTIONS) IMPLANT
GLOVE BIOGEL PI IND STRL 7.0 (GLOVE) ×1 IMPLANT
GLOVE BIOGEL PI INDICATOR 7.0 (GLOVE) ×1
GLOVE ORTHO TXT STRL SZ7.5 (GLOVE) ×2 IMPLANT
GOWN STRL REUS W/TWL LRG LVL3 (GOWN DISPOSABLE) ×4 IMPLANT
KIT ABG SYR 3ML LUER SLIP (SYRINGE) IMPLANT
NEEDLE HYPO 25X5/8 SAFETYGLIDE (NEEDLE) ×2 IMPLANT
NS IRRIG 1000ML POUR BTL (IV SOLUTION) ×2 IMPLANT
PACK C SECTION WH (CUSTOM PROCEDURE TRAY) ×2 IMPLANT
PAD OB MATERNITY 4.3X12.25 (PERSONAL CARE ITEMS) ×2 IMPLANT
PENCIL SMOKE EVAC W/HOLSTER (ELECTROSURGICAL) ×2 IMPLANT
RTRCTR C-SECT PINK 25CM LRG (MISCELLANEOUS) ×2 IMPLANT
STRIP CLOSURE SKIN 1/4X4 (GAUZE/BANDAGES/DRESSINGS) ×2 IMPLANT
SUT CHROMIC 1 CTX 36 (SUTURE) ×4 IMPLANT
SUT PLAIN 0 NONE (SUTURE) IMPLANT
SUT PLAIN 2 0 XLH (SUTURE) IMPLANT
SUT VIC AB 0 CT1 27 (SUTURE) ×4
SUT VIC AB 0 CT1 27XBRD ANBCTR (SUTURE) ×2 IMPLANT
SUT VIC AB 2-0 CT1 (SUTURE) ×2 IMPLANT
SUT VIC AB 4-0 KS 27 (SUTURE) IMPLANT
TOWEL OR 17X24 6PK STRL BLUE (TOWEL DISPOSABLE) ×2 IMPLANT
TRAY FOLEY W/BAG SLVR 14FR LF (SET/KITS/TRAYS/PACK) ×2 IMPLANT
WATER STERILE IRR 1000ML POUR (IV SOLUTION) ×2 IMPLANT

## 2020-08-31 NOTE — Anesthesia Postprocedure Evaluation (Signed)
Anesthesia Post Note  Patient: Redmond Baseman  Procedure(s) Performed: REPEAT CESAREAN SECTION (N/A )     Patient location during evaluation: PACU Anesthesia Type: Spinal Level of consciousness: awake and alert and oriented Pain management: pain level controlled Vital Signs Assessment: post-procedure vital signs reviewed and stable Respiratory status: spontaneous breathing, nonlabored ventilation and respiratory function stable Cardiovascular status: blood pressure returned to baseline and stable Postop Assessment: no headache, no backache, spinal receding, patient able to bend at knees and no apparent nausea or vomiting Anesthetic complications: no   No complications documented.  Last Vitals:  Vitals:   08/31/20 1000 08/31/20 1011  BP: 134/90 130/79  Pulse: 71 61  Resp: 16 18  Temp: 36.4 C 36.6 C  SpO2: 100% 97%    Last Pain:  Vitals:   08/31/20 1011  TempSrc: Oral  PainSc: 3    Pain Goal:    LLE Motor Response: Purposeful movement (08/31/20 1000) LLE Sensation: Tingling (08/31/20 1000) RLE Motor Response: Purposeful movement (08/31/20 1000) RLE Sensation: Tingling (08/31/20 1000)     Epidural/Spinal Function Cutaneous sensation: Tingles (08/31/20 1000), Patient able to flex knees: Yes (08/31/20 1000), Patient able to lift hips off bed: No (08/31/20 1000), Back pain beyond tenderness at insertion site: No (08/31/20 1000), Progressively worsening motor and/or sensory loss: No (08/31/20 1000), Bowel and/or bladder incontinence post epidural: No (08/31/20 1000)  Lannie Fields

## 2020-08-31 NOTE — Anesthesia Procedure Notes (Signed)
Spinal  Patient location during procedure: OR Start time: 08/31/2020 7:22 AM End time: 08/31/2020 7:26 AM Reason for block: surgical anesthesia Staffing Performed: anesthesiologist  Anesthesiologist: Lannie Fields, DO Preanesthetic Checklist Completed: patient identified, IV checked, risks and benefits discussed, surgical consent, monitors and equipment checked, pre-op evaluation and timeout performed Spinal Block Patient position: sitting Prep: DuraPrep and site prepped and draped Patient monitoring: cardiac monitor, continuous pulse ox and blood pressure Approach: midline Location: L3-4 Injection technique: single-shot Needle Needle type: Pencan  Needle gauge: 24 G Needle length: 9 cm Assessment Sensory level: T6 Events: CSF return Additional Notes Functioning IV was confirmed and monitors were applied. Sterile prep and drape, including hand hygiene and sterile gloves were used. The patient was positioned and the spine was prepped. The skin was anesthetized with lidocaine.  Free flow of clear CSF was obtained prior to injecting local anesthetic into the CSF.  The spinal needle aspirated freely following injection.  The needle was carefully withdrawn.  The patient tolerated the procedure well.

## 2020-08-31 NOTE — Op Note (Signed)
Preoperative diagnosis: Intrauterine pregnancy at 39 weeks, previous c-section-declines TOLAC Postoperative diagnosis: Same Procedure: Repeat low transverse cesarean section without extensions Surgeon: Lavina Hamman M.D. Anesthesia: Spinal  Findings: Patient had normal gravid anatomy and delivered a viable female infant with Apgars of 8 and 9 weight pending Estimated blood loss: 400 cc Specimens: Placenta sent to labor and delivery Complications: None  Procedure in detail: The patient was taken to the operating room and placed in the sitting position. The anesthesiologist instilled spinal anesthesia.  She was then placed in the dorsosupine position with left tilt. Abdomen was then prepped and draped in the usual sterile fashion, and a foley catheter was inserted. The level of her anesthesia was found to be adequate. Abdomen was entered via a standard Pfannenstiel incision, inferior to her previous scar. Once the peritoneal cavity was entered the Alexis disposable self-retaining retractor was placed and good visualization was achieved. A 4 cm transverse incision was then made in the lower uterine segment pushing the bladder inferior. Once the uterine cavity was entered the incision was extended digitally, clear amniotic fluid. The fetal vertex was grasped and delivered through the incision atraumatically. Mouth and nares were suctioned. The remainder of the infant then delivered atraumatically. Cord was doubly clamped and cut after one minute and the infant handed to the awaiting pediatric team. Cord blood was obtained. The placenta delivered spontaneously. Uterus was wiped dry with clean lap pad and all clots and debris were removed. Omental adhesion to the bladder flap was taken down with Bovie.  Uterine incision was inspected and found to be free of extensions. Uterine incision was closed in 1 layer with running #1 Chromic, an additional figure 8 of #1 Chromic used at left angle for hemostasis. Tubes and  ovaries were inspected and found to be normal. Uterine incision was inspected and found to be hemostatic. Bleeding from serosal edges was controlled with electrocautery. The Alexis retractor was removed. Subfascial space was irrigated and made hemostatic with electrocautery. Peritoneum was closed with 2-0 Vicryl.  Fascia was closed in running fashion starting at both ends and meeting in the middle with 0 Vicryl. Subcutaneous tissue was then irrigated and made hemostatic with electrocautery, then closed with running 2-0 plain gut. Skin was closed with running 4-0 Vicryl subcuticular suture followed by steri-strips and a sterile dressing. Patient tolerated the procedure well and was taken to the recovery in stable condition. Counts were correct x2, she received Ancef 2 g IV at the beginning of the procedure and she had PAS hose on throughout the procedure.

## 2020-08-31 NOTE — Transfer of Care (Signed)
Immediate Anesthesia Transfer of Care Note  Patient: Jo [name]  Procedure(s) Performed: REPEAT CESAREAN SECTION (N/A )  Patient Location: PACU  Anesthesia Type:Spinal  Level of Consciousness: awake  Airway & Oxygen Therapy: Patient Spontanous Breathing  Post-op Assessment: Report given to RN  Post vital signs: Reviewed and stable  Last Vitals:  Vitals Value Taken Time  BP 112/75 08/31/20 0847  Temp    Pulse 69 08/31/20 0851  Resp 21 08/31/20 0851  SpO2 100 % 08/31/20 0851  Vitals shown include unvalidated device data.  Last Pain:  Vitals:   08/31/20 0634  TempSrc: Oral         Complications: No complications documented.

## 2020-08-31 NOTE — Interval H&P Note (Signed)
History and Physical Interval Note:  08/31/2020 7:12 AM  Jo [name]  has presented today for surgery, with the diagnosis of repeat c-section.  The various methods of treatment have been discussed with the patient and family. After consideration of risks, benefits and other options for treatment, the patient has consented to  Procedure(s): REPEAT CESAREAN SECTION (N/A) as a surgical intervention.  The patient's history has been reviewed, patient examined, no change in status, stable for surgery.  I have reviewed the patient's chart and labs.  Questions were answered to the patient's satisfaction.     Leighton Roach Khiree Bukhari

## 2020-09-01 LAB — CBC
HCT: 27 % — ABNORMAL LOW (ref 36.0–46.0)
Hemoglobin: 8.4 g/dL — ABNORMAL LOW (ref 12.0–15.0)
MCH: 26.8 pg (ref 26.0–34.0)
MCHC: 31.1 g/dL (ref 30.0–36.0)
MCV: 86 fL (ref 80.0–100.0)
Platelets: 296 10*3/uL (ref 150–400)
RBC: 3.14 MIL/uL — ABNORMAL LOW (ref 3.87–5.11)
RDW: 14 % (ref 11.5–15.5)
WBC: 17.4 10*3/uL — ABNORMAL HIGH (ref 4.0–10.5)
nRBC: 0 % (ref 0.0–0.2)

## 2020-09-01 LAB — BIRTH TISSUE RECOVERY COLLECTION (PLACENTA DONATION)

## 2020-09-01 NOTE — Progress Notes (Signed)
Patient ID: Jo [name], female   DOB: 1990-10-02, 30 y.o.   MRN: [id number] Pt doing well with no complaints. Bonding well with baby. She reports pain well controlled with medication, ambulating and tolerating diet well, voiding with no difficulty. She denies HA, CP or SOB. No lightheadedness  VSS GEN - NAD ABD - soft, FF, dressing intact with scant old dried blood in middle EXT - no edema or homans   A/P: POD#1 S/p repeat c/s - stable          Routine pp/post op care         Desires circumcision for baby

## 2020-09-02 MED ORDER — IBUPROFEN 600 MG PO TABS: 600.0000 mg | ORAL_TABLET | Freq: Four times a day (QID) | ORAL | 0 refills | Status: DC

## 2020-09-02 MED ORDER — ACETAMINOPHEN 500 MG PO TABS
1000.0000 mg | ORAL_TABLET | Freq: Four times a day (QID) | ORAL | 0 refills | Status: DC
Start: 2020-09-02 — End: 2023-05-13

## 2020-09-02 NOTE — Progress Notes (Signed)
Subjective: Postpartum Day 2: Cesarean Delivery Patient reports tolerating PO, + flatus and no problems voiding. Desires d/c home  Objective: Vital signs in last 24 hours: Temp:  [97.6 F (36.4 C)-98.2 F (36.8 C)] 98.2 F (36.8 C) (05/04 0531) Pulse Rate:  [71-73] 71 (05/04 0531) Resp:  [15-18] 18 (05/04 0531) BP: (100-118)/(63-79) 118/75 (05/04 0531) SpO2:  [98 %] 98 % (05/04 0531)  Physical Exam:  General: alert and cooperative Lochia: appropriate Uterine Fundus: firm Incision: C/D/I   Recent Labs    09/01/20 0555  HGB 8.4*  HCT 27.0*    Assessment/Plan: Status post Cesarean section. Doing well postoperatively. Tolerating mild anemia well. D/c home  Jo [name] 09/02/2020, 10:59 AM

## 2020-09-02 NOTE — Discharge Summary (Signed)
Postpartum Discharge Summary       Patient Name: Jo [name] DOB: [birth year] MRN: [id number]  Date of admission: 08/31/2020 Delivery date:08/31/2020  Delivering provider: Jackelyn Knife, TODD  Date of discharge: 09/02/2020  Admitting diagnosis: S/P cesarean section [Z98.891] Maternal care due to low transverse uterine scar from previous cesarean delivery [O34.211] Intrauterine pregnancy: [redacted]w[redacted]d     Secondary diagnosis:  Active Problems:   S/P cesarean section   Maternal care due to low transverse uterine scar from previous cesarean delivery  Additional problems: mild chronic anemia with acute blood loss    Discharge diagnosis: Term Pregnancy Delivered                                              Post partum procedures:none Augmentation: N/A Complications: None  Hospital course: Sceduled C/S   30 y.o. yo G2P2002 at [redacted]w[redacted]d was admitted to the hospital 08/31/2020 for scheduled cesarean section with the following indication:Elective Repeat.Delivery details are as follows:  Membrane Rupture Time/Date: 7:57 AM ,08/31/2020   Delivery Method:C-Section, Low Transverse  Details of operation can be found in separate operative note.  Patient had an uncomplicated postpartum course.  She is ambulating, tolerating a regular diet, passing flatus, and urinating well. Patient is discharged home in stable condition on  09/02/20        Newborn Data: Birth date:08/31/2020  Birth time:7:59 AM  Gender:Female  Living status:Living  Apgars:8 ,9  Weight:3925 g     Magnesium Sulfate received: No BMZ received: No Rhophylac:No   Physical exam  Vitals:   09/01/20 0311 09/01/20 1244 09/01/20 2125 09/02/20 0531  BP: 124/89 100/63 112/79 118/75  Pulse: 75 73 72 71  Resp: 16 17 15 18   Temp: 98 F (36.7 C) 97.6 F (36.4 C) 98.2 F (36.8 C) 98.2 F (36.8 C)  TempSrc: Oral Oral Oral Oral  SpO2: 98%   98%  Weight:      Height:       General: alert and cooperative Lochia: appropriate Uterine Fundus:  firm Incision: Dressing is clean, dry, and intact DVT Evaluation: No evidence of DVT seen on physical exam. Labs: Lab Results  Component Value Date   WBC 17.4 (H) 09/01/2020   HGB 8.4 (L) 09/01/2020   HCT 27.0 (L) 09/01/2020   MCV 86.0 09/01/2020   PLT 296 09/01/2020   CMP Latest Ref Rng & Units 05/12/2020  Glucose 70 - 99 mg/dL 91  BUN 6 - 20 mg/dL 6  Creatinine 07/10/2020 - 4.97 mg/dL 0.26  Sodium 3.78 - 588 mmol/L 135  Potassium 3.5 - 5.1 mmol/L 3.5  Chloride 98 - 111 mmol/L 105  CO2 22 - 32 mmol/L 22  Calcium 8.9 - 10.3 mg/dL 502)  Total Protein 6.5 - 8.1 g/dL -  Total Bilirubin 0.3 - 1.2 mg/dL -  Alkaline Phos 38 - 7.7(A U/L -  AST 15 - 41 U/L -  ALT 0 - 44 U/L -   Edinburgh Score: Edinburgh Postnatal Depression Scale Screening Tool 09/01/2020  I have been able to laugh and see the funny side of things. 0  I have looked forward with enjoyment to things. 0  I have blamed myself unnecessarily when things went wrong. 1  I have been anxious or worried for no good reason. 0  I have felt scared or panicky for no good reason. 0  Things have  been getting on top of me. 0  I have been so unhappy that I have had difficulty sleeping. 0  I have felt sad or miserable. 0  I have been so unhappy that I have been crying. 0  The thought of harming myself has occurred to me. 0  Edinburgh Postnatal Depression Scale Total 1     After visit meds:  Allergies as of 09/02/2020   No Known Allergies     Medication List    STOP taking these medications   calcium carbonate 500 MG chewable tablet Commonly known as: TUMS - dosed in mg elemental calcium   cefadroxil 500 MG capsule Commonly known as: DURICEF     TAKE these medications   acetaminophen 500 MG tablet Commonly known as: TYLENOL Take 2 tablets (1,000 mg total) by mouth every 6 (six) hours.   ibuprofen 600 MG tablet Commonly known as: ADVIL Take 1 tablet (600 mg total) by mouth every 6 (six) hours.        Discharge home in  stable condition Infant Feeding: Bottle Infant Disposition:home with mother Discharge instruction: per After Visit Summary and Postpartum booklet. Activity: Advance as tolerated. Pelvic rest for 6 weeks.  Diet: routine diet Future Appointments:No future appointments. Follow up Visit:  Follow-up Information    Meisinger, Todd, MD. Schedule an appointment as soon as possible for a visit in 2 week(s).   Specialty: Obstetrics and Gynecology Why: incision check Contact information: [address], [address] [place] [address] [phone]                Please schedule this patient for a In person postpartum visit in 2 weeks with the following provider: MD.  Delivery mode:  C-Section, Low Transverse  Anticipated Birth Control:  Unsure   09/02/2020 Oliver Pila, MD

## 2022-09-25 ENCOUNTER — Other Ambulatory Visit: Payer: Self-pay

## 2022-09-25 ENCOUNTER — Emergency Department (HOSPITAL_BASED_OUTPATIENT_CLINIC_OR_DEPARTMENT_OTHER)
Admission: EM | Admit: 2022-09-25 | Discharge: 2022-09-25 | Disposition: A | Discharge status: Home / Self Care | Payer: 59 | Attending: Emergency Medicine | Admitting: Emergency Medicine

## 2022-09-25 ENCOUNTER — Encounter (HOSPITAL_BASED_OUTPATIENT_CLINIC_OR_DEPARTMENT_OTHER): Payer: Self-pay | Admitting: Emergency Medicine

## 2022-09-25 DIAGNOSIS — K121 Other forms of stomatitis: Secondary | ICD-10-CM

## 2022-09-25 DIAGNOSIS — K1230 Oral mucositis (ulcerative), unspecified: Secondary | ICD-10-CM | POA: Insufficient documentation

## 2022-09-25 DIAGNOSIS — R220 Localized swelling, mass and lump, head: Secondary | ICD-10-CM | POA: Diagnosis present

## 2022-09-25 LAB — PREGNANCY, URINE: Preg Test, Ur: NEGATIVE

## 2022-09-25 MED ORDER — INV-VIKTORIA-1-DEXAMETHASONE ORAL SOLUTION FOR MOUTH RINSE (PI:GUDENA): 10.0000 mL | Freq: Four times a day (QID) | ORAL | 0 refills | Status: AC

## 2022-09-25 MED ORDER — VALACYCLOVIR HCL 1 G PO TABS: 1000.0000 mg | ORAL_TABLET | Freq: Three times a day (TID) | ORAL | 0 refills | Status: DC

## 2022-09-25 MED ORDER — DEXAMETHASONE SODIUM PHOSPHATE 10 MG/ML IJ SOLN
10.0000 mg | Freq: Once | INTRAMUSCULAR | Status: AC
  Administered 2022-09-25: 10 mg via INTRAMUSCULAR
  Filled 2022-09-25: qty 1

## 2022-09-25 MED ORDER — INV-VIKTORIA-1-DEXAMETHASONE ORAL SOLUTION FOR MOUTH RINSE (PI:GUDENA): 10.0000 mL | Freq: Four times a day (QID) | ORAL | 0 refills | Status: DC

## 2022-09-25 MED ORDER — VALACYCLOVIR HCL 500 MG PO TABS
1000.0000 mg | ORAL_TABLET | Freq: Once | ORAL | Status: AC
  Administered 2022-09-25: 1000 mg via ORAL
  Filled 2022-09-25: qty 2

## 2022-09-25 MED ORDER — LIDOCAINE VISCOUS HCL 2 % MT SOLN
15.0000 mL | Freq: Once | OROMUCOSAL | Status: AC
  Administered 2022-09-25: 15 mL via OROMUCOSAL
  Filled 2022-09-25: qty 15

## 2022-09-25 NOTE — ED Provider Notes (Addendum)
[place] EMERGENCY DEPARTMENT AT [facility] Provider Note   CSN: [id number] Arrival date & time: 09/25/22  1848     History  No chief complaint on file.  HPI Jo [name] is a 32 y.o. female with noncontributory medical history presenting for facial swelling.  Started on Monday.  Swelling began in her upper lip and extended through the midportion of both her cheeks.  She was seen by urgent care at that time who thought she may have an infection with associated with the ring in her lip.  The lip ring was removed and she was started on amoxicillin.  She has not developed ulcers on her lips and all in her mouth that are extremely painful.  Of note her son is tested positive for strep but she was negative.  States she is sexually active with 1 partner for the last 10 years.  Denies trouble breathing.   HPI     Home Medications Prior to Admission medications   Medication Sig Start Date End Date Taking? Authorizing Provider  INV-VIKTORIA-1-dexamethasone oral solution 10 mL mouth rinse Swish and spit 10 mLs 4 (four) times daily for 4 days. For Investigational Use Only. Swish solution around mouth for 2 minutes, then spit out. Do not ingest or swallow. Do not take anything by mouth for 1 hour after dexamethasone. Document dexamethasone mouthwash usage with medication log. 09/25/22 09/29/22 Yes Gareth Eagle, PA-C  valACYclovir (VALTREX) 1000 MG tablet Take 1 tablet (1,000 mg total) by mouth 3 (three) times daily. 09/25/22  Yes Gareth Eagle, PA-C  acetaminophen (TYLENOL) 500 MG tablet Take 2 tablets (1,000 mg total) by mouth every 6 (six) hours. 09/02/20   Huel Cote, MD  ibuprofen (ADVIL) 600 MG tablet Take 1 tablet (600 mg total) by mouth every 6 (six) hours. 09/02/20   Huel Cote, MD      Allergies    Patient has no known allergies.    Review of Systems   See HPI for pertinent positives  Physical Exam Updated Vital Signs BP (!) 108/94   Pulse 77   Temp  98.3 F (36.8 C)   Resp 18   Wt 64.9 kg   SpO2 100%   BMI 23.08 kg/m  Physical Exam Vitals and nursing note reviewed.  HENT:     Head: Normocephalic and atraumatic.     Mouth/Throat:     Lips: Pink. Lesions present.     Mouth: Mucous membranes are moist.     Pharynx: Uvula midline. Posterior oropharyngeal erythema present. No pharyngeal swelling or oropharyngeal exudate.      Comments: Upper lip mildly edematous.   Eyes:     General:        Right eye: No discharge.        Left eye: No discharge.     Conjunctiva/sclera: Conjunctivae normal.  Cardiovascular:     Rate and Rhythm: Normal rate and regular rhythm.     Pulses: Normal pulses.     Heart sounds: Normal heart sounds.  Pulmonary:     Effort: Pulmonary effort is normal.     Breath sounds: Normal breath sounds.  Abdominal:     General: Abdomen is flat.     Palpations: Abdomen is soft.  Skin:    General: Skin is warm and dry.  Neurological:     General: No focal deficit present.  Psychiatric:        Mood and Affect: Mood normal.     ED Results / Procedures /  Treatments   Labs (all labs ordered are listed, but only abnormal results are displayed) Labs Reviewed  VIRUS CULTURE  PREGNANCY, URINE    EKG None  Radiology No results found.  Procedures Procedures    Medications Ordered in ED Medications  valACYclovir (VALTREX) tablet 1,000 mg (1,000 mg Oral Given 09/25/22 2128)  dexamethasone (DECADRON) injection 10 mg (10 mg Intramuscular Given 09/25/22 2135)    ED Course/ Medical Decision Making/ A&P                             Medical Decision Making Amount and/or Complexity of Data Reviewed Labs: ordered.  Risk Prescription drug management.   32 year old female presenting for facial swelling and ulcers.  Exam notable for ulcerated lesions about her exterior lips and her mouth.  DDx includes herpetic lesion versus aphthous stomatitis.  Treated for both essentially with Decadron and Valtrex.  Also  treated pain with viscous lidocaine.  Also sent a viral culture. Advised her to follow-up on that culture on her MyChart.  Overall appears clinically well in no concern at this time for respiratory distress. Sent oral rinse dexamethasone solution and Valtrex to her pharmacy as well.  Advised her to follow-up with her PCP discussed pertinent return precautions.  Vital stable at discharge.  Discharged home.   Final Clinical Impression(s) / ED Diagnoses Final diagnoses:  Mouth ulcers  Facial swelling    Rx / DC Orders ED Discharge Orders          Ordered    INV-VIKTORIA-1-dexamethasone oral solution 10 mL mouth rinse  4 times daily       Note to Pharmacy: Provide dosing cups to patient. Advise patient to swish 10 mL of dexamethasone 0.5mg /5 mL solution for 2 minutes then spit, 4 times daily for the first 8 weeks, then as clinically indicated for an additionial 8 weeks. Complete daily log to document dexamethasone mouthwash usage.   09/25/22 2124    valACYclovir (VALTREX) 1000 MG tablet  3 times daily        09/25/22 2124              Gareth Eagle, PA-C 09/25/22 2136    Gareth Eagle, PA-C 09/25/22 2138    Elayne Snare K, DO 09/25/22 2311

## 2022-09-25 NOTE — ED Triage Notes (Signed)
Pt woke up Monday ,face swollen, cut out lip ring. Went to urgent care, treated with antibiotic.cheeks aren't as swollen.but the mouth has gotten worse. She has ulcers and bleeding gums.her son did test pos for strep and she was negative

## 2022-09-25 NOTE — Discharge Instructions (Addendum)
Evaluation today revealed you have multiple ulcerated lesions in your mouth and lips.  Concern is herpetic virus versus canker sores.  Treatment for now will be Valtrex which is an antiviral and a oral swish and spit steroid for canker sores.  Advised that you follow-up on the viral culture on your MyChart.  Also recommend you follow-up with your PCP.  If you start to have worsening facial swelling, shortness of breath, drooling or trouble swallowing or any other concern please return emerged part for further evaluation.

## 2022-09-27 ENCOUNTER — Telehealth (HOSPITAL_BASED_OUTPATIENT_CLINIC_OR_DEPARTMENT_OTHER): Payer: Self-pay | Admitting: Emergency Medicine

## 2022-09-28 LAB — HSV AND VZV PCR PANEL
HSV 1 DNA: POSITIVE — AB
HSV 2 DNA: NEGATIVE
Varicella-Zoster, PCR: NEGATIVE

## 2023-05-12 ENCOUNTER — Encounter (HOSPITAL_BASED_OUTPATIENT_CLINIC_OR_DEPARTMENT_OTHER): Payer: Self-pay

## 2023-05-12 ENCOUNTER — Inpatient Hospital Stay (HOSPITAL_BASED_OUTPATIENT_CLINIC_OR_DEPARTMENT_OTHER)
Admission: EM | Admit: 2023-05-12 | Discharge: 2023-05-18 | DRG: 964 | Disposition: A | Discharge status: Home / Self Care | Payer: 59 | Attending: General Surgery | Admitting: General Surgery

## 2023-05-12 ENCOUNTER — Other Ambulatory Visit: Payer: Self-pay

## 2023-05-12 DIAGNOSIS — Z3201 Encounter for pregnancy test, result positive: Secondary | ICD-10-CM | POA: Diagnosis present

## 2023-05-12 DIAGNOSIS — M62838 Other muscle spasm: Secondary | ICD-10-CM | POA: Diagnosis present

## 2023-05-12 DIAGNOSIS — J939 Pneumothorax, unspecified: Secondary | ICD-10-CM | POA: Diagnosis present

## 2023-05-12 DIAGNOSIS — Z98891 History of uterine scar from previous surgery: Secondary | ICD-10-CM

## 2023-05-12 DIAGNOSIS — K683 Retroperitoneal hematoma: Secondary | ICD-10-CM | POA: Diagnosis present

## 2023-05-12 DIAGNOSIS — S32029A Unspecified fracture of second lumbar vertebra, initial encounter for closed fracture: Secondary | ICD-10-CM | POA: Diagnosis present

## 2023-05-12 DIAGNOSIS — R1110 Vomiting, unspecified: Secondary | ICD-10-CM | POA: Diagnosis not present

## 2023-05-12 DIAGNOSIS — S32009A Unspecified fracture of unspecified lumbar vertebra, initial encounter for closed fracture: Secondary | ICD-10-CM

## 2023-05-12 DIAGNOSIS — S270XXA Traumatic pneumothorax, initial encounter: Secondary | ICD-10-CM | POA: Diagnosis not present

## 2023-05-12 DIAGNOSIS — S32049A Unspecified fracture of fourth lumbar vertebra, initial encounter for closed fracture: Secondary | ICD-10-CM | POA: Diagnosis present

## 2023-05-12 DIAGNOSIS — S2242XA Multiple fractures of ribs, left side, initial encounter for closed fracture: Secondary | ICD-10-CM | POA: Diagnosis present

## 2023-05-12 DIAGNOSIS — K7689 Other specified diseases of liver: Secondary | ICD-10-CM | POA: Diagnosis present

## 2023-05-12 DIAGNOSIS — R519 Headache, unspecified: Secondary | ICD-10-CM | POA: Diagnosis present

## 2023-05-12 DIAGNOSIS — S36114A Minor laceration of liver, initial encounter: Secondary | ICD-10-CM | POA: Diagnosis present

## 2023-05-12 DIAGNOSIS — Z8719 Personal history of other diseases of the digestive system: Secondary | ICD-10-CM

## 2023-05-12 DIAGNOSIS — S36119A Unspecified injury of liver, initial encounter: Secondary | ICD-10-CM | POA: Diagnosis present

## 2023-05-12 DIAGNOSIS — Z8249 Family history of ischemic heart disease and other diseases of the circulatory system: Secondary | ICD-10-CM

## 2023-05-12 DIAGNOSIS — Y9323 Activity, snow (alpine) (downhill) skiing, snow boarding, sledding, tobogganing and snow tubing: Principal | ICD-10-CM

## 2023-05-12 DIAGNOSIS — R079 Chest pain, unspecified: Secondary | ICD-10-CM | POA: Diagnosis not present

## 2023-05-12 DIAGNOSIS — S32038A Other fracture of third lumbar vertebra, initial encounter for closed fracture: Secondary | ICD-10-CM | POA: Diagnosis present

## 2023-05-12 DIAGNOSIS — S42112A Displaced fracture of body of scapula, left shoulder, initial encounter for closed fracture: Secondary | ICD-10-CM | POA: Diagnosis present

## 2023-05-12 DIAGNOSIS — S36892A Contusion of other intra-abdominal organs, initial encounter: Secondary | ICD-10-CM | POA: Diagnosis present

## 2023-05-12 DIAGNOSIS — S32019A Unspecified fracture of first lumbar vertebra, initial encounter for closed fracture: Secondary | ICD-10-CM | POA: Diagnosis present

## 2023-05-12 DIAGNOSIS — S7002XA Contusion of left hip, initial encounter: Secondary | ICD-10-CM | POA: Diagnosis present

## 2023-05-12 LAB — CBC WITH DIFFERENTIAL/PLATELET
Abs Immature Granulocytes: 0.13 10*3/uL — ABNORMAL HIGH (ref 0.00–0.07)
Basophils Absolute: 0.1 10*3/uL (ref 0.0–0.1)
Basophils Relative: 0 %
Eosinophils Absolute: 0 10*3/uL (ref 0.0–0.5)
Eosinophils Relative: 0 %
HCT: 35.4 % — ABNORMAL LOW (ref 36.0–46.0)
Hemoglobin: 12.4 g/dL (ref 12.0–15.0)
Immature Granulocytes: 1 %
Lymphocytes Relative: 11 %
Lymphs Abs: 1.5 10*3/uL (ref 0.7–4.0)
MCH: 32.5 pg (ref 26.0–34.0)
MCHC: 35 g/dL (ref 30.0–36.0)
MCV: 92.9 fL (ref 80.0–100.0)
Monocytes Absolute: 0.7 10*3/uL (ref 0.1–1.0)
Monocytes Relative: 5 %
Neutro Abs: 11.2 10*3/uL — ABNORMAL HIGH (ref 1.7–7.7)
Neutrophils Relative %: 83 %
Platelets: 236 10*3/uL (ref 150–400)
RBC: 3.81 MIL/uL — ABNORMAL LOW (ref 3.87–5.11)
RDW: 11.6 % (ref 11.5–15.5)
WBC: 13.6 10*3/uL — ABNORMAL HIGH (ref 4.0–10.5)
nRBC: 0 % (ref 0.0–0.2)

## 2023-05-12 LAB — BASIC METABOLIC PANEL
Anion gap: 10 (ref 5–15)
BUN: 15 mg/dL (ref 6–20)
CO2: 20 mmol/L — ABNORMAL LOW (ref 22–32)
Calcium: 8.9 mg/dL (ref 8.9–10.3)
Chloride: 105 mmol/L (ref 98–111)
Creatinine, Ser: 0.79 mg/dL (ref 0.44–1.00)
GFR, Estimated: >60 mL/min (ref >60)
Glucose, Bld: 118 mg/dL — ABNORMAL HIGH (ref 70–99)
Potassium: 3.5 mmol/L (ref 3.5–5.1)
Sodium: 135 mmol/L (ref 135–145)

## 2023-05-12 LAB — HCG, SERUM, QUALITATIVE: Preg, Serum: POSITIVE — AB

## 2023-05-12 MED ORDER — SODIUM CHLORIDE 0.9 % IV BOLUS
500.0000 mL | Freq: Once | INTRAVENOUS | Status: AC
  Administered 2023-05-12: 500 mL via INTRAVENOUS

## 2023-05-12 MED ORDER — MORPHINE SULFATE (PF) 4 MG/ML IV SOLN
4.0000 mg | Freq: Once | INTRAVENOUS | Status: AC
  Administered 2023-05-12: 4 mg via INTRAVENOUS
  Filled 2023-05-12: qty 1

## 2023-05-12 MED ORDER — ONDANSETRON HCL 4 MG/2ML IJ SOLN
4.0000 mg | Freq: Once | INTRAMUSCULAR | Status: AC
  Administered 2023-05-12: 4 mg via INTRAVENOUS
  Filled 2023-05-12: qty 2
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
## 2023-05-12 NOTE — ED Provider Notes (Signed)
  EMERGENCY DEPARTMENT AT [facility] Provider Note   CSN: [id number] Arrival date & time: 05/12/23  2300     History  Chief Complaint  Patient presents with   Back Injury    Jo [name] is a 33 y.o. female.  Patient is a 33 year old female with no significant past medical history.  Patient presenting today for evaluation of a sledding injury.  Patient was sliding down a hill when she lost control and struck a tree.  She is complaining of severe pain to her right lower back and flank.  She is unable to ambulate and was loaded into the car by her family, then transported here.  She describes pain when she breathes.  No weakness or numbness in the lower extremities.  She denies any head injury, headache, neck pain.  The history is provided by the patient.       Home Medications Prior to Admission medications   Medication Sig Start Date End Date Taking? Authorizing Provider  acetaminophen  (TYLENOL ) 500 MG tablet Take 2 tablets (1,000 mg total) by mouth every 6 (six) hours. 09/02/20   Estelle Service, MD  ibuprofen  (ADVIL ) 600 MG tablet Take 1 tablet (600 mg total) by mouth every 6 (six) hours. 09/02/20   Estelle Service, MD  valACYclovir  (VALTREX ) 1000 MG tablet Take 1 tablet (1,000 mg total) by mouth 3 (three) times daily. 09/25/22   Robinson, John K, PA-C      Allergies    Patient has no known allergies.    Review of Systems   Review of Systems  All other systems reviewed and are negative.   Physical Exam Updated Vital Signs There were no vitals taken for this visit. Physical Exam Vitals and nursing note reviewed.  Constitutional:      General: She is not in acute distress.    Appearance: She is well-developed. She is not diaphoretic.  HENT:     Head: Normocephalic and atraumatic.  Cardiovascular:     Rate and Rhythm: Normal rate and regular rhythm.     Heart sounds: No murmur heard.    No friction rub. No gallop.  Pulmonary:     Effort:  Pulmonary effort is normal. No respiratory distress.     Breath sounds: Normal breath sounds. No wheezing.  Abdominal:     General: Bowel sounds are normal. There is no distension.     Palpations: Abdomen is soft.     Tenderness: There is no abdominal tenderness.  Musculoskeletal:        General: Normal range of motion.     Cervical back: Normal range of motion and neck supple.     Comments: Exquisite tenderness noted over the right flank and right sacral region.  There is no palpable abnormality.  Strength is 5 out of 5 in both lower extremities.  Sensation is intact throughout both lower extremities.  Skin:    General: Skin is warm and dry.  Neurological:     General: No focal deficit present.     Mental Status: She is alert and oriented to person, place, and time.     Motor: No weakness.     ED Results / Procedures / Treatments   Labs (all labs ordered are listed, but only abnormal results are displayed) Labs Reviewed  BASIC METABOLIC PANEL  CBC WITH DIFFERENTIAL/PLATELET  HCG, SERUM, QUALITATIVE    EKG None  Radiology No results found.  Procedures Procedures    Medications Ordered in ED Medications  morphine  (PF)  4 MG/ML injection 4 mg (has no administration in time range)  sodium chloride  0.9 % bolus 500 mL (has no administration in time range)  ondansetron  (ZOFRAN ) injection 4 mg (has no administration in time range)    ED Course/ Medical Decision Making/ A&P  Patient is a 33 year old female presenting by private auto after a sled riding accident, the details of which are described above.  Patient arrives with stable vital signs and is afebrile.  There is no hypoxia.  She has tenderness to palpation of the left flank and left lumbar region.  Breath sounds are equal and patient is in no respiratory distress.  Oxygen saturations 100%.  Laboratory studies obtained including CBC and basic metabolic panel, both of which are unremarkable.  Pregnancy test is positive,  but patient reports having undergone an abortion 2 weeks ago.  I suspect the hCG elevation is related to decreasing levels that have not returned to 0.  CT scan of the cervical spine, chest, abdomen, and pelvis obtained.  Studies show multiple thoracic and abdominal injuries including multiple rib fractures, small pneumothorax, psoas contusion with retroperitoneal hematoma, lumbar spine transverse process fractures, liver hematoma, and what appears to be a developing mesenteric hematoma.  The above imaging findings were discussed with Dr. Sebastian and patient will be admitted to a progressive bed under the trauma service.  Pneumothorax not significant enough to cause respiratory compromise and measures approximately 20 mm with from lung edge to chest wall.  Will refrain from invasive procedures such as chest tube.  She has received multiple doses of pain medication while here in the ER and continues to experience significant discomfort.  Patient transferred to [facility].  CRITICAL CARE Performed by: Vicenta Able Total critical care time: 45 minutes Critical care time was exclusive of separately billable procedures and treating other patients. Critical care was necessary to treat or prevent imminent or life-threatening deterioration. Critical care was time spent personally by me on the following activities: development of treatment plan with patient and/or surrogate as well as nursing, discussions with consultants, evaluation of patient's response to treatment, examination of patient, obtaining history from patient or surrogate, ordering and performing treatments and interventions, ordering and review of laboratory studies, ordering and review of radiographic studies, pulse oximetry and re-evaluation of patient's condition.   Final Clinical Impression(s) / ED Diagnoses Final diagnoses:  None    Rx / DC Orders ED Discharge Orders     None         Able Vicenta, MD 05/13/23 [phone]

## 2023-05-13 ENCOUNTER — Inpatient Hospital Stay (HOSPITAL_COMMUNITY): Payer: 59

## 2023-05-13 ENCOUNTER — Emergency Department (HOSPITAL_BASED_OUTPATIENT_CLINIC_OR_DEPARTMENT_OTHER): Payer: 59

## 2023-05-13 ENCOUNTER — Encounter (HOSPITAL_COMMUNITY): Payer: Self-pay | Admitting: General Surgery

## 2023-05-13 DIAGNOSIS — S42112A Displaced fracture of body of scapula, left shoulder, initial encounter for closed fracture: Secondary | ICD-10-CM | POA: Diagnosis present

## 2023-05-13 DIAGNOSIS — Z8249 Family history of ischemic heart disease and other diseases of the circulatory system: Secondary | ICD-10-CM | POA: Diagnosis not present

## 2023-05-13 DIAGNOSIS — S32049A Unspecified fracture of fourth lumbar vertebra, initial encounter for closed fracture: Secondary | ICD-10-CM | POA: Diagnosis present

## 2023-05-13 DIAGNOSIS — S32038A Other fracture of third lumbar vertebra, initial encounter for closed fracture: Secondary | ICD-10-CM | POA: Diagnosis present

## 2023-05-13 DIAGNOSIS — K7689 Other specified diseases of liver: Secondary | ICD-10-CM | POA: Diagnosis present

## 2023-05-13 DIAGNOSIS — S32019A Unspecified fracture of first lumbar vertebra, initial encounter for closed fracture: Secondary | ICD-10-CM | POA: Diagnosis present

## 2023-05-13 DIAGNOSIS — S36119A Unspecified injury of liver, initial encounter: Secondary | ICD-10-CM | POA: Diagnosis present

## 2023-05-13 DIAGNOSIS — S270XXA Traumatic pneumothorax, initial encounter: Secondary | ICD-10-CM | POA: Diagnosis present

## 2023-05-13 DIAGNOSIS — M62838 Other muscle spasm: Secondary | ICD-10-CM | POA: Diagnosis present

## 2023-05-13 DIAGNOSIS — S32029A Unspecified fracture of second lumbar vertebra, initial encounter for closed fracture: Secondary | ICD-10-CM | POA: Diagnosis present

## 2023-05-13 DIAGNOSIS — R1110 Vomiting, unspecified: Secondary | ICD-10-CM | POA: Diagnosis not present

## 2023-05-13 DIAGNOSIS — Z8719 Personal history of other diseases of the digestive system: Secondary | ICD-10-CM | POA: Diagnosis not present

## 2023-05-13 DIAGNOSIS — K683 Retroperitoneal hematoma: Secondary | ICD-10-CM | POA: Diagnosis present

## 2023-05-13 DIAGNOSIS — S2242XA Multiple fractures of ribs, left side, initial encounter for closed fracture: Secondary | ICD-10-CM | POA: Diagnosis present

## 2023-05-13 DIAGNOSIS — S7002XA Contusion of left hip, initial encounter: Secondary | ICD-10-CM | POA: Diagnosis present

## 2023-05-13 DIAGNOSIS — Z98891 History of uterine scar from previous surgery: Secondary | ICD-10-CM | POA: Diagnosis not present

## 2023-05-13 DIAGNOSIS — R079 Chest pain, unspecified: Secondary | ICD-10-CM | POA: Diagnosis present

## 2023-05-13 DIAGNOSIS — Z3201 Encounter for pregnancy test, result positive: Secondary | ICD-10-CM | POA: Diagnosis present

## 2023-05-13 DIAGNOSIS — S36114A Minor laceration of liver, initial encounter: Secondary | ICD-10-CM | POA: Diagnosis present

## 2023-05-13 DIAGNOSIS — R519 Headache, unspecified: Secondary | ICD-10-CM | POA: Diagnosis present

## 2023-05-13 DIAGNOSIS — Y9323 Activity, snow (alpine) (downhill) skiing, snow boarding, sledding, tobogganing and snow tubing: Secondary | ICD-10-CM | POA: Diagnosis not present

## 2023-05-13 DIAGNOSIS — S36892A Contusion of other intra-abdominal organs, initial encounter: Secondary | ICD-10-CM | POA: Diagnosis present

## 2023-05-13 DIAGNOSIS — J939 Pneumothorax, unspecified: Secondary | ICD-10-CM | POA: Diagnosis present

## 2023-05-13 LAB — HIV ANTIBODY (ROUTINE TESTING W REFLEX): HIV Screen 4th Generation wRfx: NONREACTIVE

## 2023-05-13 LAB — HCG, QUANTITATIVE, PREGNANCY: hCG, Beta Chain, Quant, S: 108 m[IU]/mL — ABNORMAL HIGH (ref <5)

## 2023-05-13 MED ORDER — LIDOCAINE HCL (CARDIAC) PF 100 MG/5ML IV SOSY
PREFILLED_SYRINGE | INTRAVENOUS | Status: AC
  Filled 2023-05-13: qty 5

## 2023-05-13 MED ORDER — FENTANYL CITRATE (PF) 100 MCG/2ML IJ SOLN
INTRAMUSCULAR | Status: AC
  Filled 2023-05-13: qty 2

## 2023-05-13 MED ORDER — ACETAMINOPHEN 500 MG PO TABS
1000.0000 mg | ORAL_TABLET | Freq: Four times a day (QID) | ORAL | Status: DC
  Administered 2023-05-13 – 2023-05-18 (×21): 1000 mg via ORAL
  Filled 2023-05-13 (×21): qty 2

## 2023-05-13 MED ORDER — DIPHENHYDRAMINE HCL 12.5 MG/5ML PO ELIX
25.0000 mg | ORAL_SOLUTION | Freq: Four times a day (QID) | ORAL | Status: DC | PRN
  Administered 2023-05-13 – 2023-05-15 (×4): 25 mg via ORAL
  Filled 2023-05-13 (×4): qty 10

## 2023-05-13 MED ORDER — ONDANSETRON 4 MG PO TBDP
4.0000 mg | ORAL_TABLET | Freq: Four times a day (QID) | ORAL | Status: DC | PRN
  Administered 2023-05-14: 4 mg via ORAL
  Filled 2023-05-13: qty 1

## 2023-05-13 MED ORDER — HYDROMORPHONE HCL 1 MG/ML IJ SOLN
0.5000 mg | INTRAMUSCULAR | Status: DC | PRN
  Administered 2023-05-13 (×2): 0.5 mg via INTRAVENOUS
  Filled 2023-05-13 (×3): qty 0.5

## 2023-05-13 MED ORDER — MORPHINE SULFATE (PF) 4 MG/ML IV SOLN
4.0000 mg | Freq: Once | INTRAVENOUS | Status: AC
  Administered 2023-05-13: 4 mg via INTRAVENOUS
  Filled 2023-05-13: qty 1

## 2023-05-13 MED ORDER — IOHEXOL 350 MG/ML SOLN: 100.0000 mL | Freq: Once | INTRAVENOUS | Status: DC | PRN

## 2023-05-13 MED ORDER — HYDROMORPHONE HCL 1 MG/ML IJ SOLN
0.5000 mg | INTRAMUSCULAR | Status: AC
Start: 2023-05-13 — End: 2023-05-13
  Administered 2023-05-13: 0.5 mg via INTRAVENOUS

## 2023-05-13 MED ORDER — SODIUM CHLORIDE 0.9% FLUSH
3.0000 mL | Freq: Two times a day (BID) | INTRAVENOUS | Status: DC
  Administered 2023-05-13 – 2023-05-17 (×9): 3 mL via INTRAVENOUS

## 2023-05-13 MED ORDER — MIDAZOLAM HCL 2 MG/2ML IJ SOLN
INTRAMUSCULAR | Status: AC
  Filled 2023-05-13: qty 2

## 2023-05-13 MED ORDER — POLYETHYLENE GLYCOL 3350 17 G PO PACK: 17.0000 g | PACK | Freq: Every day | ORAL | Status: DC | PRN

## 2023-05-13 MED ORDER — METHOCARBAMOL 1000 MG/10ML IJ SOLN
1000.0000 mg | Freq: Three times a day (TID) | INTRAMUSCULAR | Status: DC
  Administered 2023-05-13 – 2023-05-14 (×5): 1000 mg via INTRAVENOUS
  Filled 2023-05-13 (×5): qty 10

## 2023-05-13 MED ORDER — SODIUM CHLORIDE 0.9% FLUSH: 3.0000 mL | INTRAVENOUS | Status: DC | PRN

## 2023-05-13 MED ORDER — SODIUM CHLORIDE 0.9 % IV SOLN
250.0000 mL | INTRAVENOUS | Status: AC | PRN
Start: 2023-05-13 — End: 2023-05-14

## 2023-05-13 MED ORDER — ONDANSETRON HCL 4 MG/2ML IJ SOLN
4.0000 mg | Freq: Four times a day (QID) | INTRAMUSCULAR | Status: DC | PRN
  Administered 2023-05-13 – 2023-05-14 (×3): 4 mg via INTRAVENOUS
  Filled 2023-05-13 (×3): qty 2

## 2023-05-13 MED ORDER — OXYCODONE HCL 5 MG PO TABS: 5.0000 mg | ORAL_TABLET | ORAL | Status: DC | PRN

## 2023-05-13 MED ORDER — ONDANSETRON HCL 4 MG/2ML IJ SOLN
INTRAMUSCULAR | Status: AC
  Administered 2023-05-13: 4 mg
  Filled 2023-05-13: qty 2

## 2023-05-13 MED ORDER — LIDOCAINE HCL 1 % IJ SOLN
INTRAMUSCULAR | Status: AC
  Filled 2023-05-13: qty 10

## 2023-05-13 MED ORDER — FENTANYL CITRATE (PF) 100 MCG/2ML IJ SOLN
INTRAMUSCULAR | Status: AC | PRN
  Administered 2023-05-13 (×2): 50 ug via INTRAVENOUS

## 2023-05-13 MED ORDER — HYDRALAZINE HCL 20 MG/ML IJ SOLN
10.0000 mg | INTRAMUSCULAR | Status: DC | PRN
Start: 2023-05-13 — End: 2023-05-18

## 2023-05-13 MED ORDER — OXYCODONE HCL 5 MG PO TABS
10.0000 mg | ORAL_TABLET | ORAL | Status: DC | PRN
  Administered 2023-05-13 (×2): 10 mg via ORAL
  Filled 2023-05-13 (×3): qty 2

## 2023-05-13 MED ORDER — MORPHINE SULFATE (PF) 4 MG/ML IV SOLN
4.0000 mg | Freq: Once | INTRAVENOUS | Status: AC
Start: 2023-05-13 — End: 2023-05-13
  Administered 2023-05-13: 4 mg via INTRAVENOUS
  Filled 2023-05-13: qty 1

## 2023-05-13 MED ORDER — HYDROMORPHONE HCL 1 MG/ML IJ SOLN
1.0000 mg | INTRAMUSCULAR | Status: DC | PRN
  Administered 2023-05-13 – 2023-05-16 (×9): 1 mg via INTRAVENOUS
  Filled 2023-05-13 (×9): qty 1

## 2023-05-13 MED ORDER — HYDROMORPHONE HCL 1 MG/ML IJ SOLN
INTRAMUSCULAR | Status: AC
  Filled 2023-05-13: qty 1

## 2023-05-13 MED ORDER — DOCUSATE SODIUM 100 MG PO CAPS
100.0000 mg | ORAL_CAPSULE | Freq: Two times a day (BID) | ORAL | Status: DC
  Administered 2023-05-13 – 2023-05-17 (×9): 100 mg via ORAL
  Filled 2023-05-13 (×9): qty 1

## 2023-05-13 MED ORDER — GABAPENTIN 100 MG PO CAPS
100.0000 mg | ORAL_CAPSULE | Freq: Three times a day (TID) | ORAL | Status: DC
  Administered 2023-05-13 – 2023-05-14 (×4): 100 mg via ORAL
  Filled 2023-05-13 (×4): qty 1

## 2023-05-13 MED ORDER — IOHEXOL 300 MG/ML  SOLN
100.0000 mL | Freq: Once | INTRAMUSCULAR | Status: AC | PRN
  Administered 2023-05-13: 100 mL via INTRAVENOUS

## 2023-05-13 NOTE — Progress Notes (Signed)
   05/13/23 1508  Vitals  Temp 97.9 F (36.6 C)  Temp Source Oral  BP (!) 143/86  MAP (mmHg) 102  BP Location Right Arm  BP Method Automatic  Patient Position (if appropriate) Lying  Pulse Rate 92  Pulse Rate Source Monitor  ECG Heart Rate 81  Resp 17  Level of Consciousness  Level of Consciousness Alert  MEWS COLOR  MEWS Score Color Green  Oxygen Therapy  SpO2 94 %  O2 Device Room Air  Glasgow Coma Scale  Eye Opening 4  Best Verbal Response (NON-intubated) 5  Best Motor Response 6  Glasgow Coma Scale Score 15  MEWS Score  MEWS Temp 0  MEWS Systolic 0  MEWS Pulse 0  MEWS RR 0  MEWS LOC 0  MEWS Score 0   Pt returning from radiology after left chest tube placement. A&O x 4 with 10/10 pain. Chest tube connected to -20 suction. No output. Dressing clean and intact. VSS and focused assessment completed. Family at bedside.

## 2023-05-13 NOTE — ED Notes (Addendum)
 Writer called [facility] for transport to inpatient bed.

## 2023-05-13 NOTE — Plan of Care (Signed)

## 2023-05-13 NOTE — Progress Notes (Signed)
 PT Cancellation Note  Patient Details Name: Jo [name] MRN: [id number] DOB: 04/26/1991   Cancelled Treatment:    Reason Eval/Treat Not Completed: Pain limiting ability to participate - severe pain, pt declines to get up today. PT encouraged frequent pressure relief in bed, bracing with pillow as needed for pain relief when coughing/sneezing/rolling.   [name] S, PT DPT Acute Rehabilitation Services Secure Chat Preferred  Office [phone]    Jo [name] 05/13/2023, 10:48 AM

## 2023-05-13 NOTE — Procedures (Signed)
 Pre-op Diagnosis:  Traumatic pneumothorax Post-op Diagnosis:  Same Date of procedure: 05/13/23 Procedure:  Attempted left chest tube pigtail placement Surgeon: Lonni Pizza, MD Anesthesia:  Local Indications: Traumatic pneumothorax, possible radiographic evidence of tension component   Description of procedure:  She is in the left lateral decubitus position on her bed. The patient was identified and a timeout was held. The left chest prepped with Chloraprep and draped in sterile fashion.  1% lidocaine  was infiltrated in the skin and subcutaneous tissue. The pleural space was then accessed with an 18 gauge needle. Intrapleural location was confirmed by aspiration of trace air.  The guidewire was advanced but despite multiple attempts and needle repositioning, unsuccessfully able to advance beyond needle tip. We attempted to access for open chest tube placement using same site, infiltrating additional 5 cc of 1% lidocaine . She is unable to tolerate this procedure at bedside without additional sedation. Out of an ER/ICU setting, we are unable to provide conscious sedation - we will therefore consult interventional radiology for CT guided placement. Skin is closed with single suture using silk suture in kit.  A sterile dressing was applied.  Lonni Pizza, MD [facility], [facility]

## 2023-05-13 NOTE — Consult Note (Signed)
 Chief Complaint: Patient was seen in consultation today for  Chief Complaint  Patient presents with   Left pneumothorax   Referring Physician(s): Trauma   Supervising Physician: Philip Cornet  Patient Status: [facility] - In-pt  History of Present Illness: Jo [name] is a 33 y.o. female with no significant past medical history who presented to the ED late last night with severe back and flank pain following a sledding injury. She was unable to ambulate and was transported to the ED by her family. Imaging showed numerous injuries including fractures to the ribs, left scapula and lumbar vertebrae. Imaging also showed a left pneumothorax that on presentation was not significant enough for chest tube placement. A chest radiograph this morning showed the pneumothorax had slightly enlarged and a left chest tube placement was attempted by the Trauma/Surgery team. The patient was unable to tolerate the procedure with only local anesthesia.   Interventional Radiology has been consulted and asked to evaluate patient for an image-guided left chest tube placement. Imaging reviewed and procedure approved by Dr. Philip.   Past Medical History:  Diagnosis Date   GERD (gastroesophageal reflux disease)     Past Surgical History:  Procedure Laterality Date   CESAREAN SECTION N/A 03/27/2019   Procedure: CESAREAN SECTION;  Surgeon: Sudie Lavonia HERO, MD;  Location: [facility] LD ORS;  Service: Obstetrics;  Laterality: N/A;   CESAREAN SECTION N/A 08/31/2020   Procedure: REPEAT CESAREAN SECTION;  Surgeon: Horacio Boas, MD;  Location: [facility] LD ORS;  Service: Obstetrics;  Laterality: N/A;    Allergies: Patient has no known allergies.  Medications: Prior to Admission medications   Medication Sig Start Date End Date Taking? Authorizing Provider  albuterol (VENTOLIN HFA) 108 (90 Base) MCG/ACT inhaler Inhale 1-2 puffs into the lungs every 4 (four) hours as needed for wheezing or shortness of breath.   Yes [provider]  HAILEY 24 FE 1-20 MG-MCG(24) tablet Take 1 tablet by mouth daily. 03/28/23  Yes [provider]  HYDROMET 5-1.5 MG/5ML syrup Take 5 mLs by mouth 2 (two) times daily as needed for cough. 04/10/23  Yes [provider]  Ibuprofen -Acetaminophen  (ADVIL  DUAL ACTION) 125-250 MG TABS Take 1-2 tablets by mouth 2 (two) times daily as needed (Pain).   Yes [provider]     Family History  Problem Relation Age of Onset   Hypertension Other     Social History   Socioeconomic History   Marital status: Single    Spouse name: Not on file   Number of children: Not on file   Years of education: Not on file   Highest education level: Not on file  Occupational History   Not on file  Tobacco Use   Smoking status: Never   Smokeless tobacco: Never  Vaping Use   Vaping status: Never Used  Substance and Sexual Activity   Alcohol use: Not Currently   Drug use: Not Currently   Sexual activity: Yes  Other Topics Concern   Not on file  Social History Narrative   Not on file   Social Drivers of Health   Financial Resource Strain: Not on file  Food Insecurity: No Food Insecurity (05/13/2023)   Hunger Vital Sign    Worried About Running Out of Food in the Last Year: Never true    Ran Out of Food in the Last Year: Never true  Transportation Needs: No Transportation Needs (05/13/2023)   PRAPARE - Administrator, Civil Service (Medical): No  Lack of Transportation (Non-Medical): No  Physical Activity: Not on file  Stress: Not on file  Social Connections: Not on file    Review of Systems: A 12 point ROS discussed and pertinent positives are indicated in the HPI above.  All other systems are negative.  Review of Systems  Constitutional:  Positive for appetite change and fatigue.  Respiratory:  Positive for shortness of breath.   Gastrointestinal:  Positive for nausea and vomiting.  Neurological:  Positive for headaches.    Vital Signs: BP  101/62   Pulse 64   Temp 97.6 F (36.4 C) (Oral)   Resp 15   Ht 5' 6 (1.676 m)   Wt 142 lb (64.4 kg)   SpO2 98%   BMI 22.92 kg/m   Physical Exam Constitutional:      Comments: Appears uncomfortable.    HENT:     Mouth/Throat:     Mouth: Mucous membranes are moist.     Pharynx: Oropharynx is clear.  Cardiovascular:     Rate and Rhythm: Normal rate.     Pulses: Normal pulses.  Pulmonary:     Effort: Pulmonary effort is normal.  Abdominal:     Tenderness: There is no abdominal tenderness.  Skin:    General: Skin is warm and dry.  Neurological:     Mental Status: She is alert and oriented to person, place, and time.     Imaging: DG CHEST PORT 1 VIEW Result Date: 05/13/2023 CLINICAL DATA:  Pneumothorax EXAM: PORTABLE CHEST 1 VIEW COMPARISON:  Same day CT FINDINGS: Small-moderate-sized left-sided pneumothorax. Very slight rightward deviation of the heart and mediastinum concerning for developing tension morphology. Heart size is normal. Both lungs are clear. No right-sided pneumothorax. Left-sided rib fractures including displaced posterior left third rib fracture. No subcutaneous emphysema. IMPRESSION: Small-moderate-sized left-sided pneumothorax with findings concerning for developing tension morphology. These results will be called to the ordering clinician or representative by the Radiologist Assistant, and communication documented in the PACS or [employer]. Electronically Signed   By: Mabel Converse D.O.   On: 05/13/2023 10:54   CT Cervical Spine Wo Contrast Result Date: 05/13/2023 CLINICAL DATA:  Presents with neck pain following a sledding accident. EXAM: CT CERVICAL SPINE WITHOUT CONTRAST TECHNIQUE: Multidetector CT imaging of the cervical spine was performed without intravenous contrast. Multiplanar CT image reconstructions were also generated. RADIATION DOSE REDUCTION: This exam was performed according to the departmental dose-optimization program which includes  automated exposure control, adjustment of the mA and/or kV according to patient size and/or use of iterative reconstruction technique. COMPARISON:  Contemporaneous chest, abdomen and pelvis CT. FINDINGS: Technical note: The patient was scanned right lateral decubitus due to condition. Alignment: Normal. Skull base and vertebrae: No acute fracture is evident. No primary bone lesion or focal pathologic process in the cervical spine. Soft tissues and spinal canal: No prevertebral fluid or swelling. No visible canal hematoma. Disc levels: There is preservation of the normal cervical disc heights. No herniated discs, cord compromise or other significant soft tissue or bony encroachment on the thecal sac is seen. Arthritic changes are not seen. The foramina are clear. Early endplate spurring is beginning to develop anteriorly at C4-5 and C5-6. Upper chest: Left apical pneumothorax is againNoted as well as a nondisplaced posterior left first rib fracture. Other: Bilaterally impacted mandibular wisdom teeth. IMPRESSION: 1. No evidence of cervical fractures or malalignment. 2. Early endplate spurring anteriorly at C4-5 and C5-6. 3. Left apical pneumothorax and nondisplaced posterior left first rib  fracture. See chest CT report. Electronically Signed   By: Francis Quam M.D.   On: 05/13/2023 03:41   CT CHEST ABDOMEN PELVIS W CONTRAST Result Date: 05/13/2023 CLINICAL DATA:  Polytrauma, blunt EXAM: CT CHEST, ABDOMEN, AND PELVIS WITH CONTRAST TECHNIQUE: Multidetector CT imaging of the chest, abdomen and pelvis was performed following the standard protocol during bolus administration of intravenous contrast. RADIATION DOSE REDUCTION: This exam was performed according to the departmental dose-optimization program which includes automated exposure control, adjustment of the mA and/or kV according to patient size and/or use of iterative reconstruction technique. CONTRAST:  OMNIPAQUE  IOHEXOL  300 MG/ML  SOLN COMPARISON:   None Available. FINDINGS: CHEST: Cardiovascular: No aortic injury. The thoracic aorta is normal in caliber. The heart is normal in size. No significant pericardial effusion. Mediastinum/Nodes: No pneumomediastinum. No mediastinal hematoma. The esophagus is unremarkable. The thyroid is unremarkable. The central airways are patent. No mediastinal, hilar, or axillary lymphadenopathy. Lungs/Pleura: No focal consolidation. No pulmonary nodule. No pulmonary mass. No pulmonary contusion or laceration. No pneumatocele formation. No pleural effusion. Small to moderate volume left pneumothorax. No right pneumothorax. No hemothorax. Musculoskeletal/Chest wall: No chest wall mass. Acute minimally displaced posterior left 1st rib fracture. Acute displaced left anterior 3rd rib fractures. Acute nondisplaced left anterior 4-6 rib fractures. No acute displaced sternal fracture. Likely acute nondisplaced left scapular fracture (2:19). No spinal fracture. ABDOMEN / PELVIS: Hepatobiliary: Not enlarged. A 2.2 x 1 x 4 cm subcapsular hypodensity along the left medial hepatic lobe (2:52). The gallbladder is otherwise unremarkable with no radio-opaque gallstones. No biliary ductal dilatation. Pancreas: Normal pancreatic contour. No main pancreatic duct dilatation. Spleen: Not enlarged. No focal lesion. No laceration, subcapsular hematoma, or vascular injury. Adrenals/Urinary Tract: No nodularity bilaterally. Bilateral kidneys enhance symmetrically. No hydronephrosis. No contusion, laceration, or subcapsular hematoma. No injury to the vascular structures or collecting systems. No hydroureter. The urinary bladder is unremarkable. On delayed imaging, there is no urothelial wall thickening and there are no filling defects in the opacified portions of the bilateral collecting systems or ureters. Stomach/Bowel: No small or large bowel wall thickening or dilatation. The appendix is unremarkable. Vasculature/Lymphatics: No abdominal aorta or iliac  aneurysm. No active contrast extravasation or pseudoaneurysm. No abdominal, pelvic, inguinal lymphadenopathy. Reproductive: Uterus and bilateral adnexal regions are unremarkable. Other: No simple free fluid ascites. No pneumoperitoneum. No hemoperitoneum. Question vague haziness of the small bowel mesentery with several loops of associated fluid-filled small bowel (not dilated) (2:85-90). No organized fluid collection. Musculoskeletal: Small fat containing umbilical hernia. Mild subcutaneus soft tissue hematoma along the lower back. Irregularity and asymmetric enlargement of the left psoas muscle with associated hypodense heterogeneity (2:87). No acute pelvic fracture. Right posterior acetabular lytic lesion likely of benign etiology. Please see separately dictated CT lumbar spine 05/13/2023. Ports and Devices: None. IMPRESSION: 1. Small to moderate volume pneumothorax. 2. A 2.2 x 1 x 4 cm left hepatic lobe subcapsular hematoma. 3. Left psoas intramuscular hematoma with retroperitoneal hemorrhage. 4. Developing small bowel mesenteric hematoma not fully excluded. Recommend serial abdominal exam and follow-up. 5. Acute minimally displaced posterior left 1st rib fracture. Acute displaced left anterior 3rd rib fractures. Acute nondisplaced left anterior 4-6 rib fractures. 6. Likely acute nondisplaced left scapular fracture 7. No acute fracture or traumatic malalignment of the thoracic spine. 8. Please see separately dictated CT lumbar spine 05/13/2023. 9. Other imaging findings of potential clinical significance: Right posterior acetabular lytic lesion likely of benign etiology. Correlation with prior radiography or cross-sectional imaging would be of  value. These results were called by telephone at the time of interpretation on 05/13/2023 at 1:36 am to provider VICENTA ABLE , who verbally acknowledged these results. Electronically Signed   By: Morgane  Naveau M.D.   On: 05/13/2023 01:44   CT L-SPINE NO CHARGE Result  Date: 05/13/2023 CLINICAL DATA:  Lumbar radiculopathy, trauma EXAM: CT LUMBAR SPINE WITHOUT CONTRAST TECHNIQUE: Multidetector CT imaging of the lumbar spine was performed without intravenous contrast administration. Multiplanar CT image reconstructions were also generated. RADIATION DOSE REDUCTION: This exam was performed according to the departmental dose-optimization program which includes automated exposure control, adjustment of the mA and/or kV according to patient size and/or use of iterative reconstruction technique. COMPARISON:  CT chest abdomen pelvis 05/13/2023 FINDINGS: Segmentation: 5 lumbar type vertebrae. Alignment: Normal. Vertebrae: Acute displaced left L1, L2, L3, L4 transverse process fractures. No focal pathologic process. Paraspinal and other soft tissues: Left psoas intramuscular hematoma with retroperitoneal hemorrhage. Disc levels: Maintained. IMPRESSION: Acute displaced left L1, L2, L3, L4 transverse process fractures. Associated left psoas intramuscular hematoma with retroperitoneal hemorrhage. Electronically Signed   By: Morgane  Naveau M.D.   On: 05/13/2023 01:42    Labs:  CBC: Recent Labs    05/12/23 2313  WBC 13.6*  HGB 12.4  HCT 35.4*  PLT 236    COAGS: No results for input(s): INR, APTT in the last 8760 hours.  BMP: Recent Labs    05/12/23 2313  NA 135  K 3.5  CL 105  CO2 20*  GLUCOSE 118*  BUN 15  CALCIUM 8.9  CREATININE 0.79  GFRNONAA >60    LIVER FUNCTION TESTS: No results for input(s): BILITOT, AST, ALT, ALKPHOS, PROT, ALBUMIN in the last 8760 hours.  TUMOR MARKERS: No results for input(s): AFPTM, CEA, CA199, CHROMGRNA in the last 8760 hours.  Assessment and Plan:  Trauma; left pneumothorax: Laurette R. Tina, 33 year old female, is scheduled today for an image-guided left chest tube placement. The procedure was discussed at the bedside with the patient, her mother and her boyfriend. The patient had a light meal  approximately one hour ago. Patient aware the procedure will likely need to be performed with only one sedating drug.   Risks and benefits discussed with the patient including bleeding, infection, damage to adjacent structures, perforation/fistula connection, and sepsis.  All of the patient's questions were answered, patient is agreeable to proceed.  Consent signed and in chart.  Thank you for this interesting consult.  I greatly enjoyed meeting Jo [name] and look forward to participating in their care.  A copy of this report was sent to the requesting provider on this date.  Electronically Signed: Warren Dais, AGACNP-BC 05/13/2023, 12:18 PM   I spent a total of 20 Minutes    in face to face in clinical consultation, greater than 50% of which was counseling/coordinating care for left chest tube placement.

## 2023-05-13 NOTE — Progress Notes (Addendum)
 Pt arrived on the unit via [facility]. Pt alert and oriented x 4.Pt arrived with stable vital sign. Foley cath upon admission, Pt admitted to tele. C/o pain level of 5/10. Family at bedside. Admitting MD notified.

## 2023-05-13 NOTE — H&P (Signed)
 Jo [name] is an 33 y.o. female.   Chief Complaint: L back and chest pain HPI: 33yo F was sledding with a friend on top of a golf cart hood when they struck a tree.  She felt like it knocked the breath out of her.  She had a lot of left-sided chest pain and was evaluated at [facility].  She was found to have multiple injuries and was accepted in transfer to the trauma service.  On arrival she complains of left chest pain and lower back pain with a lot of muscle spasms.  No shortness of breath.  Past Medical History:  Diagnosis Date   GERD (gastroesophageal reflux disease)     Past Surgical History:  Procedure Laterality Date   CESAREAN SECTION N/A 03/27/2019   Procedure: CESAREAN SECTION;  Surgeon: Sudie Lavonia HERO, MD;  Location: [facility] LD ORS;  Service: Obstetrics;  Laterality: N/A;   CESAREAN SECTION N/A 08/31/2020   Procedure: REPEAT CESAREAN SECTION;  Surgeon: Horacio Boas, MD;  Location: [facility] LD ORS;  Service: Obstetrics;  Laterality: N/A;    Family History  Problem Relation Age of Onset   Hypertension Other    Social History:  reports that she has never smoked. She has never used smokeless tobacco. She reports that she does not currently use alcohol. She reports that she does not currently use drugs.  Allergies: No Known Allergies  Medications Prior to Admission  Medication Sig Dispense Refill   acetaminophen  (TYLENOL ) 500 MG tablet Take 2 tablets (1,000 mg total) by mouth every 6 (six) hours. 30 tablet 0   ibuprofen  (ADVIL ) 600 MG tablet Take 1 tablet (600 mg total) by mouth every 6 (six) hours. 30 tablet 0   valACYclovir  (VALTREX ) 1000 MG tablet Take 1 tablet (1,000 mg total) by mouth 3 (three) times daily. 21 tablet 0    Results for orders placed or performed during the hospital encounter of 05/12/23 (from the past 48 hours)  Basic metabolic panel     Status: Abnormal   Collection Time: 05/12/23 11:13 PM  Result Value Ref Range   Sodium 135 135 - 145 mmol/L    Potassium 3.5 3.5 - 5.1 mmol/L   Chloride 105 98 - 111 mmol/L   CO2 20 (L) 22 - 32 mmol/L   Glucose, Bld 118 (H) 70 - 99 mg/dL    Comment: Glucose reference range applies only to samples taken after fasting for at least 8 hours.   BUN 15 6 - 20 mg/dL   Creatinine, Ser 9.20 0.44 - 1.00 mg/dL   Calcium 8.9 8.9 - 89.6 mg/dL   GFR, Estimated >39 >39 mL/min    Comment: (NOTE) Calculated using the CKD-EPI Creatinine Equation (2021)    Anion gap 10 5 - 15    Comment: Performed at [facility], [address], [place], [place] [address]  CBC with Differential     Status: Abnormal   Collection Time: 05/12/23 11:13 PM  Result Value Ref Range   WBC 13.6 (H) 4.0 - 10.5 K/uL   RBC 3.81 (L) 3.87 - 5.11 MIL/uL   Hemoglobin 12.4 12.0 - 15.0 g/dL   HCT 64.5 (L) 63.9 - 53.9 %   MCV 92.9 80.0 - 100.0 fL   MCH 32.5 26.0 - 34.0 pg   MCHC 35.0 30.0 - 36.0 g/dL   RDW 88.3 88.4 - 84.4 %   Platelets 236 150 - 400 K/uL   nRBC 0.0 0.0 - 0.2 %   Neutrophils Relative %  83 %   Neutro Abs 11.2 (H) 1.7 - 7.7 K/uL   Lymphocytes Relative 11 %   Lymphs Abs 1.5 0.7 - 4.0 K/uL   Monocytes Relative 5 %   Monocytes Absolute 0.7 0.1 - 1.0 K/uL   Eosinophils Relative 0 %   Eosinophils Absolute 0.0 0.0 - 0.5 K/uL   Basophils Relative 0 %   Basophils Absolute 0.1 0.0 - 0.1 K/uL   Immature Granulocytes 1 %   Abs Immature Granulocytes 0.13 (H) 0.00 - 0.07 K/uL    Comment: Performed at [facility], [address], [place], [place] [address]  hCG, serum, qualitative     Status: Abnormal   Collection Time: 05/12/23 11:13 PM  Result Value Ref Range   Preg, Serum POSITIVE (A) NEGATIVE    Comment:        THE SENSITIVITY OF THIS METHODOLOGY IS >10 mIU/mL. Performed at [facility], [address] [place], [place] [address]   hCG, quantitative, pregnancy     Status: Abnormal   Collection Time: 05/12/23 11:22 PM  Result Value Ref Range   hCG, Beta Chain,  Quant, S 108 (H) <5 mIU/mL    Comment:          GEST. AGE      CONC.  (mIU/mL)   <=1 WEEK        5 - 50     2 WEEKS       50 - 500     3 WEEKS       100 - 10,000     4 WEEKS     1,000 - 30,000     5 WEEKS     3,500 - 115,000   6-8 WEEKS     12,000 - 270,000    12 WEEKS     15,000 - 220,000        FEMALE AND NON-PREGNANT FEMALE:     LESS THAN 5 mIU/mL Performed at [facility], [address], [place], [place] [address]    CT Cervical Spine Wo Contrast Result Date: 05/13/2023 CLINICAL DATA:  Presents with neck pain following a sledding accident. EXAM: CT CERVICAL SPINE WITHOUT CONTRAST TECHNIQUE: Multidetector CT imaging of the cervical spine was performed without intravenous contrast. Multiplanar CT image reconstructions were also generated. RADIATION DOSE REDUCTION: This exam was performed according to the departmental dose-optimization program which includes automated exposure control, adjustment of the mA and/or kV according to patient size and/or use of iterative reconstruction technique. COMPARISON:  Contemporaneous chest, abdomen and pelvis CT. FINDINGS: Technical note: The patient was scanned right lateral decubitus due to condition. Alignment: Normal. Skull base and vertebrae: No acute fracture is evident. No primary bone lesion or focal pathologic process in the cervical spine. Soft tissues and spinal canal: No prevertebral fluid or swelling. No visible canal hematoma. Disc levels: There is preservation of the normal cervical disc heights. No herniated discs, cord compromise or other significant soft tissue or bony encroachment on the thecal sac is seen. Arthritic changes are not seen. The foramina are clear. Early endplate spurring is beginning to develop anteriorly at C4-5 and C5-6. Upper chest: Left apical pneumothorax is againNoted as well as a nondisplaced posterior left first rib fracture. Other: Bilaterally impacted mandibular wisdom teeth. IMPRESSION: 1. No  evidence of cervical fractures or malalignment. 2. Early endplate spurring anteriorly at C4-5 and C5-6. 3. Left apical pneumothorax and nondisplaced posterior left first rib fracture. See chest CT report. Electronically Signed   By:  Francis Quam M.D.   On: 05/13/2023 03:41   CT CHEST ABDOMEN PELVIS W CONTRAST Result Date: 05/13/2023 CLINICAL DATA:  Polytrauma, blunt EXAM: CT CHEST, ABDOMEN, AND PELVIS WITH CONTRAST TECHNIQUE: Multidetector CT imaging of the chest, abdomen and pelvis was performed following the standard protocol during bolus administration of intravenous contrast. RADIATION DOSE REDUCTION: This exam was performed according to the departmental dose-optimization program which includes automated exposure control, adjustment of the mA and/or kV according to patient size and/or use of iterative reconstruction technique. CONTRAST:  OMNIPAQUE  IOHEXOL  300 MG/ML  SOLN COMPARISON:  None Available. FINDINGS: CHEST: Cardiovascular: No aortic injury. The thoracic aorta is normal in caliber. The heart is normal in size. No significant pericardial effusion. Mediastinum/Nodes: No pneumomediastinum. No mediastinal hematoma. The esophagus is unremarkable. The thyroid is unremarkable. The central airways are patent. No mediastinal, hilar, or axillary lymphadenopathy. Lungs/Pleura: No focal consolidation. No pulmonary nodule. No pulmonary mass. No pulmonary contusion or laceration. No pneumatocele formation. No pleural effusion. Small to moderate volume left pneumothorax. No right pneumothorax. No hemothorax. Musculoskeletal/Chest wall: No chest wall mass. Acute minimally displaced posterior left 1st rib fracture. Acute displaced left anterior 3rd rib fractures. Acute nondisplaced left anterior 4-6 rib fractures. No acute displaced sternal fracture. Likely acute nondisplaced left scapular fracture (2:19). No spinal fracture. ABDOMEN / PELVIS: Hepatobiliary: Not enlarged. A 2.2 x 1 x 4 cm subcapsular  hypodensity along the left medial hepatic lobe (2:52). The gallbladder is otherwise unremarkable with no radio-opaque gallstones. No biliary ductal dilatation. Pancreas: Normal pancreatic contour. No main pancreatic duct dilatation. Spleen: Not enlarged. No focal lesion. No laceration, subcapsular hematoma, or vascular injury. Adrenals/Urinary Tract: No nodularity bilaterally. Bilateral kidneys enhance symmetrically. No hydronephrosis. No contusion, laceration, or subcapsular hematoma. No injury to the vascular structures or collecting systems. No hydroureter. The urinary bladder is unremarkable. On delayed imaging, there is no urothelial wall thickening and there are no filling defects in the opacified portions of the bilateral collecting systems or ureters. Stomach/Bowel: No small or large bowel wall thickening or dilatation. The appendix is unremarkable. Vasculature/Lymphatics: No abdominal aorta or iliac aneurysm. No active contrast extravasation or pseudoaneurysm. No abdominal, pelvic, inguinal lymphadenopathy. Reproductive: Uterus and bilateral adnexal regions are unremarkable. Other: No simple free fluid ascites. No pneumoperitoneum. No hemoperitoneum. Question vague haziness of the small bowel mesentery with several loops of associated fluid-filled small bowel (not dilated) (2:85-90). No organized fluid collection. Musculoskeletal: Small fat containing umbilical hernia. Mild subcutaneus soft tissue hematoma along the lower back. Irregularity and asymmetric enlargement of the left psoas muscle with associated hypodense heterogeneity (2:87). No acute pelvic fracture. Right posterior acetabular lytic lesion likely of benign etiology. Please see separately dictated CT lumbar spine 05/13/2023. Ports and Devices: None. IMPRESSION: 1. Small to moderate volume pneumothorax. 2. A 2.2 x 1 x 4 cm left hepatic lobe subcapsular hematoma. 3. Left psoas intramuscular hematoma with retroperitoneal hemorrhage. 4. Developing  small bowel mesenteric hematoma not fully excluded. Recommend serial abdominal exam and follow-up. 5. Acute minimally displaced posterior left 1st rib fracture. Acute displaced left anterior 3rd rib fractures. Acute nondisplaced left anterior 4-6 rib fractures. 6. Likely acute nondisplaced left scapular fracture 7. No acute fracture or traumatic malalignment of the thoracic spine. 8. Please see separately dictated CT lumbar spine 05/13/2023. 9. Other imaging findings of potential clinical significance: Right posterior acetabular lytic lesion likely of benign etiology. Correlation with prior radiography or cross-sectional imaging would be of value. These results were called by telephone at the time  of interpretation on 05/13/2023 at 1:36 am to provider VICENTA ABLE , who verbally acknowledged these results. Electronically Signed   By: Morgane  Naveau M.D.   On: 05/13/2023 01:44   CT L-SPINE NO CHARGE Result Date: 05/13/2023 CLINICAL DATA:  Lumbar radiculopathy, trauma EXAM: CT LUMBAR SPINE WITHOUT CONTRAST TECHNIQUE: Multidetector CT imaging of the lumbar spine was performed without intravenous contrast administration. Multiplanar CT image reconstructions were also generated. RADIATION DOSE REDUCTION: This exam was performed according to the departmental dose-optimization program which includes automated exposure control, adjustment of the mA and/or kV according to patient size and/or use of iterative reconstruction technique. COMPARISON:  CT chest abdomen pelvis 05/13/2023 FINDINGS: Segmentation: 5 lumbar type vertebrae. Alignment: Normal. Vertebrae: Acute displaced left L1, L2, L3, L4 transverse process fractures. No focal pathologic process. Paraspinal and other soft tissues: Left psoas intramuscular hematoma with retroperitoneal hemorrhage. Disc levels: Maintained. IMPRESSION: Acute displaced left L1, L2, L3, L4 transverse process fractures. Associated left psoas intramuscular hematoma with retroperitoneal  hemorrhage. Electronically Signed   By: Morgane  Naveau M.D.   On: 05/13/2023 01:42    Review of Systems  Constitutional: Negative.   HENT: Negative.    Eyes: Negative.   Respiratory:  Negative for shortness of breath.   Cardiovascular:  Positive for chest pain.  Gastrointestinal:  Negative for abdominal pain.  Endocrine: Negative.   Genitourinary: Negative.   Musculoskeletal:  Positive for back pain.  Allergic/Immunologic: Negative.   Neurological: Negative.   Hematological: Negative.   Psychiatric/Behavioral: Negative.      Blood pressure 104/67, pulse 96, temperature 98.8 F (37.1 C), temperature source Oral, resp. rate 15, height 5' 6 (1.676 m), weight 64.4 kg, SpO2 100%, unknown if currently breastfeeding. Physical Exam HENT:     Head: Normocephalic.     Mouth/Throat:     Mouth: Mucous membranes are moist.  Eyes:     General: No scleral icterus.    Pupils: Pupils are equal, round, and reactive to light.  Cardiovascular:     Rate and Rhythm: Normal rate and regular rhythm.     Pulses: Normal pulses.  Pulmonary:     Effort: Pulmonary effort is normal.     Breath sounds: No wheezing or rhonchi.  Chest:     Chest wall: Tenderness present.  Abdominal:     General: There is no distension.     Palpations: Abdomen is soft.     Tenderness: There is abdominal tenderness. There is no guarding or rebound.     Comments: Very mild right-sided tenderness without any peritonitis  Musculoskeletal:        General: No tenderness.  Skin:    General: Skin is warm.     Capillary Refill: Capillary refill takes less than 2 seconds.  Neurological:     Mental Status: She is alert and oriented to person, place, and time.     Cranial Nerves: No cranial nerve deficit.     Comments: GCS 15  Psychiatric:        Mood and Affect: Mood normal.      Assessment/Plan Sledding and struck a tree  Left rib fractures 1, 3, 4-6 -Multimodal pain control and pulmonary toilet Left pneumothorax  -as above, does not require chest tube at this time, chest x-ray tomorrow morning Left scapular fracture -Dr. Fidel consulted via message at 630 not emergent, will place a sling Grade 1 liver laceration Questionable small bowel mesenteric hematoma -abdomen is not significantly tender.  Will allow diet and follow exam. Multiple lumbar transverse  process fractures -pain control and muscle relaxer  Admit to trauma I also spoke with her family Dann FORBES Hummer, MD 05/13/2023, 6:31 AM

## 2023-05-13 NOTE — Progress Notes (Signed)
   05/13/23 0502  Spiritual Encounters  Type of Visit Initial  Care provided to: Pt and family  Conversation partners present during encounter Nurse  Reason for visit Routine spiritual support  OnCall Visit Yes   Patient arrived via EMS, taken to room 4N05, escorted family to room, provided spiritual care.

## 2023-05-13 NOTE — Progress Notes (Addendum)
  Subjective No acute events. Feeling fine - sore as expected. No new pains or issues reported.  Objective: Vital signs in last 24 hours: Temp:  [98.8 F (37.1 C)] 98.8 F (37.1 C) (01/10 2313) Pulse Rate:  [92-106] 96 (01/11 0400) Resp:  [15-26] 15 (01/11 0446) BP: (97-115)/(65-76) 104/67 (01/11 0345) SpO2:  [96 %-100 %] 100 % (01/11 0400) Weight:  [64.4 kg] 64.4 kg (01/10 2315) Last BM Date : 05/12/23  Intake/Output from previous day: 01/10 0701 - 01/11 0700 In: 500 [IV Piggyback:500] Out: -  Intake/Output this shift: No intake/output data recorded.  Gen: NAD, comfortable CV: RRR Pulm: Normal work of breathing Abd: Soft, not significantly tender nor distended; no rebound/guarding Ext: SCDs in place  Lab Results: CBC  Recent Labs    05/12/23 2313  WBC 13.6*  HGB 12.4  HCT 35.4*  PLT 236   BMET Recent Labs    05/12/23 2313  NA 135  K 3.5  CL 105  CO2 20*  GLUCOSE 118*  BUN 15  CREATININE 0.79  CALCIUM 8.9   PT/INR No results for input(s): LABPROT, INR in the last 72 hours. ABG No results for input(s): PHART, HCO3 in the last 72 hours.  Invalid input(s): PCO2, PO2  Studies/Results:  Anti-infectives: Anti-infectives (From admission, onward)    None        Assessment/Plan: Patient Active Problem List   Diagnosis Date Noted   Pneumothorax on left 05/13/2023   Liver injury, initial encounter 05/13/2023   S/P cesarean section 08/31/2020   Maternal care due to low transverse uterine scar from previous cesarean delivery 08/31/2020   [redacted] weeks gestation of pregnancy 03/26/2019   Sledding and struck a tree   Left rib fractures 1, 3, 4-6 - Multimodal pain control and pulmonary toilet Left pneumothorax -as above, does not require chest tube at this time, chest x-ray pending at present Left scapular fracture -Dr. Fidel consulted; awaiting formal consultation; planning for sling at moment Grade 1 liver laceration Questionable small  bowel mesenteric hematoma - abdomen is not significantly tender.  Will allow diet and follow exam. Multiple lumbar transverse process fractures -pain control and muscle relaxer  PPX: SCDs for now; holding chemical dvt ppx until further from injury   LOS: 0 days    Lonni Pizza, MD [facility], A [facility]  ADDENDUM  CXR shows worsening ptx on left  with some early tension type findings possibly.   Reviewed this with her and family at bedside. Discussed recommendation for left sided chest tube. Procedure, material risks including but not limited to pain, bleeding, infection, perforation of lungs, need for additional procedures, benefits/alternatives reviewed. Her questions were answered. She expressed understanding and agreed to proceed. Witnessed informed consent obtained.

## 2023-05-13 NOTE — Progress Notes (Signed)
 Pt off unit to radiology for chest tube insertion.

## 2023-05-13 NOTE — Procedures (Signed)
 Interventional Radiology Procedure:   Indications: Traumatic left pneumothorax is enlarging  Procedure: CT guided left chest tube placement  Findings: Large left pneumothorax.  14 Fr drain placed.  Pneumothorax resolved on post chest tube images.  Complications: None     EBL: Minimal  Plan: Chest tube to wall suction for now (20 cm).    Jo Arizpe R. Philip, MD  Pager: [phone]

## 2023-05-13 NOTE — ED Notes (Signed)
 Writer called [facility] who is placing general surgery consult.

## 2023-05-13 NOTE — Progress Notes (Signed)
 Orthopedic Tech Progress Note Patient Details:  Jo [name] 1990/11/19 [id number]  Ortho Devices Type of Ortho Device: Shoulder immobilizer Ortho Device/Splint Interventions: Ordered   Post Interventions Instructions Provided: Adjustment of device, Care of device Patient declined sling at this time, left at bedside for future use. Jo [name] 05/13/2023, 10:33 AM

## 2023-05-13 NOTE — Consult Note (Signed)
 ORTHOPAEDIC CONSULTATION  REQUESTING PHYSICIAN: Md, Trauma, MD  PCP:  Patient, No Pcp Per  Chief Complaint: Left scapular body fracture  HPI: Jo [name] is a 33 y.o. female who struck a tree while sledding.  She came to the emergency department at [facility] and was then transferred to [facility].  She complained of left sided chest pain.  Trauma workup revealed multiple left rib fractures, left pneumothorax, liver laceration, small bowel hematoma, and multiple lumbar transverse process fractures.  Workup included CT scan of chest and left shoulder which showed nondisplaced left scapular body fracture.  Orthopedic consultation was placed for management of Jo [name] left scapular fracture.  Past Medical History:  Diagnosis Date   GERD (gastroesophageal reflux disease)    Past Surgical History:  Procedure Laterality Date   CESAREAN SECTION N/A 03/27/2019   Procedure: CESAREAN SECTION;  Surgeon: Sudie Lavonia HERO, MD;  Location: [facility] LD ORS;  Service: Obstetrics;  Laterality: N/A;   CESAREAN SECTION N/A 08/31/2020   Procedure: REPEAT CESAREAN SECTION;  Surgeon: Horacio Boas, MD;  Location: [facility] LD ORS;  Service: Obstetrics;  Laterality: N/A;   Social History   Socioeconomic History   Marital status: Single    Spouse name: Not on file   Number of children: Not on file   Years of education: Not on file   Highest education level: Not on file  Occupational History   Not on file  Tobacco Use   Smoking status: Never   Smokeless tobacco: Never  Vaping Use   Vaping status: Never Used  Substance and Sexual Activity   Alcohol use: Not Currently   Drug use: Not Currently   Sexual activity: Yes  Other Topics Concern   Not on file  Social History Narrative   Not on file   Social Drivers of Health   Financial Resource Strain: Not on file  Food Insecurity: No Food Insecurity (05/13/2023)   Hunger Vital Sign    Worried About Running Out of Food in the Last Year: Never true     Ran Out of Food in the Last Year: Never true  Transportation Needs: No Transportation Needs (05/13/2023)   PRAPARE - Administrator, Civil Service (Medical): No    Lack of Transportation (Non-Medical): No  Physical Activity: Not on file  Stress: Not on file  Social Connections: Not on file   Family History  Problem Relation Age of Onset   Hypertension Other    No Known Allergies Prior to Admission medications   Medication Sig Start Date End Date Taking? Authorizing Provider  albuterol (VENTOLIN HFA) 108 (90 Base) MCG/ACT inhaler Inhale 1-2 puffs into the lungs every 4 (four) hours as needed for wheezing or shortness of breath.   Yes [provider]  HAILEY 24 FE 1-20 MG-MCG(24) tablet Take 1 tablet by mouth daily. 03/28/23  Yes [provider]  HYDROMET 5-1.5 MG/5ML syrup Take 5 mLs by mouth 2 (two) times daily as needed for cough. 04/10/23  Yes [provider]  Ibuprofen -Acetaminophen  (ADVIL  DUAL ACTION) 125-250 MG TABS Take 1-2 tablets by mouth 2 (two) times daily as needed (Pain).   Yes [provider]   DG CHEST PORT 1 VIEW Result Date: 05/13/2023 CLINICAL DATA:  Pneumothorax EXAM: PORTABLE CHEST 1 VIEW COMPARISON:  Same day CT FINDINGS: Small-moderate-sized left-sided pneumothorax. Very slight rightward deviation of the heart and mediastinum concerning for developing tension morphology. Heart size is normal. Both lungs are clear. No right-sided pneumothorax. Left-sided  rib fractures including displaced posterior left third rib fracture. No subcutaneous emphysema. IMPRESSION: Small-moderate-sized left-sided pneumothorax with findings concerning for developing tension morphology. These results will be called to the ordering clinician or representative by the Radiologist Assistant, and communication documented in the PACS or [employer]. Electronically Signed   By: Mabel Converse D.O.   On: 05/13/2023 10:54   CT Cervical Spine Wo  Contrast Result Date: 05/13/2023 CLINICAL DATA:  Presents with neck pain following a sledding accident. EXAM: CT CERVICAL SPINE WITHOUT CONTRAST TECHNIQUE: Multidetector CT imaging of the cervical spine was performed without intravenous contrast. Multiplanar CT image reconstructions were also generated. RADIATION DOSE REDUCTION: This exam was performed according to the departmental dose-optimization program which includes automated exposure control, adjustment of the mA and/or kV according to patient size and/or use of iterative reconstruction technique. COMPARISON:  Contemporaneous chest, abdomen and pelvis CT. FINDINGS: Technical note: The patient was scanned right lateral decubitus due to condition. Alignment: Normal. Skull base and vertebrae: No acute fracture is evident. No primary bone lesion or focal pathologic process in the cervical spine. Soft tissues and spinal canal: No prevertebral fluid or swelling. No visible canal hematoma. Disc levels: There is preservation of the normal cervical disc heights. No herniated discs, cord compromise or other significant soft tissue or bony encroachment on the thecal sac is seen. Arthritic changes are not seen. The foramina are clear. Early endplate spurring is beginning to develop anteriorly at C4-5 and C5-6. Upper chest: Left apical pneumothorax is againNoted as well as a nondisplaced posterior left first rib fracture. Other: Bilaterally impacted mandibular wisdom teeth. IMPRESSION: 1. No evidence of cervical fractures or malalignment. 2. Early endplate spurring anteriorly at C4-5 and C5-6. 3. Left apical pneumothorax and nondisplaced posterior left first rib fracture. See chest CT report. Electronically Signed   By: Francis Quam M.D.   On: 05/13/2023 03:41   CT CHEST ABDOMEN PELVIS W CONTRAST Result Date: 05/13/2023 CLINICAL DATA:  Polytrauma, blunt EXAM: CT CHEST, ABDOMEN, AND PELVIS WITH CONTRAST TECHNIQUE: Multidetector CT imaging of the chest, abdomen and  pelvis was performed following the standard protocol during bolus administration of intravenous contrast. RADIATION DOSE REDUCTION: This exam was performed according to the departmental dose-optimization program which includes automated exposure control, adjustment of the mA and/or kV according to patient size and/or use of iterative reconstruction technique. CONTRAST:  OMNIPAQUE  IOHEXOL  300 MG/ML  SOLN COMPARISON:  None Available. FINDINGS: CHEST: Cardiovascular: No aortic injury. The thoracic aorta is normal in caliber. The heart is normal in size. No significant pericardial effusion. Mediastinum/Nodes: No pneumomediastinum. No mediastinal hematoma. The esophagus is unremarkable. The thyroid is unremarkable. The central airways are patent. No mediastinal, hilar, or axillary lymphadenopathy. Lungs/Pleura: No focal consolidation. No pulmonary nodule. No pulmonary mass. No pulmonary contusion or laceration. No pneumatocele formation. No pleural effusion. Small to moderate volume left pneumothorax. No right pneumothorax. No hemothorax. Musculoskeletal/Chest wall: No chest wall mass. Acute minimally displaced posterior left 1st rib fracture. Acute displaced left anterior 3rd rib fractures. Acute nondisplaced left anterior 4-6 rib fractures. No acute displaced sternal fracture. Likely acute nondisplaced left scapular fracture (2:19). No spinal fracture. ABDOMEN / PELVIS: Hepatobiliary: Not enlarged. A 2.2 x 1 x 4 cm subcapsular hypodensity along the left medial hepatic lobe (2:52). The gallbladder is otherwise unremarkable with no radio-opaque gallstones. No biliary ductal dilatation. Pancreas: Normal pancreatic contour. No main pancreatic duct dilatation. Spleen: Not enlarged. No focal lesion. No laceration, subcapsular hematoma, or vascular injury. Adrenals/Urinary Tract: No nodularity  bilaterally. Bilateral kidneys enhance symmetrically. No hydronephrosis. No contusion, laceration, or subcapsular hematoma. No  injury to the vascular structures or collecting systems. No hydroureter. The urinary bladder is unremarkable. On delayed imaging, there is no urothelial wall thickening and there are no filling defects in the opacified portions of the bilateral collecting systems or ureters. Stomach/Bowel: No small or large bowel wall thickening or dilatation. The appendix is unremarkable. Vasculature/Lymphatics: No abdominal aorta or iliac aneurysm. No active contrast extravasation or pseudoaneurysm. No abdominal, pelvic, inguinal lymphadenopathy. Reproductive: Uterus and bilateral adnexal regions are unremarkable. Other: No simple free fluid ascites. No pneumoperitoneum. No hemoperitoneum. Question vague haziness of the small bowel mesentery with several loops of associated fluid-filled small bowel (not dilated) (2:85-90). No organized fluid collection. Musculoskeletal: Small fat containing umbilical hernia. Mild subcutaneus soft tissue hematoma along the lower back. Irregularity and asymmetric enlargement of the left psoas muscle with associated hypodense heterogeneity (2:87). No acute pelvic fracture. Right posterior acetabular lytic lesion likely of benign etiology. Please see separately dictated CT lumbar spine 05/13/2023. Ports and Devices: None. IMPRESSION: 1. Small to moderate volume pneumothorax. 2. A 2.2 x 1 x 4 cm left hepatic lobe subcapsular hematoma. 3. Left psoas intramuscular hematoma with retroperitoneal hemorrhage. 4. Developing small bowel mesenteric hematoma not fully excluded. Recommend serial abdominal exam and follow-up. 5. Acute minimally displaced posterior left 1st rib fracture. Acute displaced left anterior 3rd rib fractures. Acute nondisplaced left anterior 4-6 rib fractures. 6. Likely acute nondisplaced left scapular fracture 7. No acute fracture or traumatic malalignment of the thoracic spine. 8. Please see separately dictated CT lumbar spine 05/13/2023. 9. Other imaging findings of potential clinical  significance: Right posterior acetabular lytic lesion likely of benign etiology. Correlation with prior radiography or cross-sectional imaging would be of value. These results were called by telephone at the time of interpretation on 05/13/2023 at 1:36 am to provider VICENTA ABLE , who verbally acknowledged these results. Electronically Signed   By: Morgane  Naveau M.D.   On: 05/13/2023 01:44   CT L-SPINE NO CHARGE Result Date: 05/13/2023 CLINICAL DATA:  Lumbar radiculopathy, trauma EXAM: CT LUMBAR SPINE WITHOUT CONTRAST TECHNIQUE: Multidetector CT imaging of the lumbar spine was performed without intravenous contrast administration. Multiplanar CT image reconstructions were also generated. RADIATION DOSE REDUCTION: This exam was performed according to the departmental dose-optimization program which includes automated exposure control, adjustment of the mA and/or kV according to patient size and/or use of iterative reconstruction technique. COMPARISON:  CT chest abdomen pelvis 05/13/2023 FINDINGS: Segmentation: 5 lumbar type vertebrae. Alignment: Normal. Vertebrae: Acute displaced left L1, L2, L3, L4 transverse process fractures. No focal pathologic process. Paraspinal and other soft tissues: Left psoas intramuscular hematoma with retroperitoneal hemorrhage. Disc levels: Maintained. IMPRESSION: Acute displaced left L1, L2, L3, L4 transverse process fractures. Associated left psoas intramuscular hematoma with retroperitoneal hemorrhage. Electronically Signed   By: Morgane  Naveau M.D.   On: 05/13/2023 01:42    Positive ROS: All other systems have been reviewed and were otherwise negative with the exception of those mentioned in the HPI and as above.  Physical Exam: General: Alert, no acute distress Cardiovascular: No pedal edema Respiratory: No cyanosis, no use of accessory musculature GI: No organomegaly, abdomen is soft and non-tender Skin: No lesions in the area of chief complaint Neurologic:  Sensation intact distally Psychiatric: Patient is competent for consent with normal mood and affect Lymphatic: No axillary or cervical lymphadenopathy  MUSCULOSKELETAL:   LUE: No skin wounds or lesions.  No deformity.  No  crepitation with range of motion.  Intact motor function AIN, PIN, and ulnar motor nerves.  Intact sensation axillary, most cutaneous, radial, ulnar, and median nerve distributions.  2+ radial pulse.  Assessment: Closed left scapular body fracture.  Plan: I discussed the findings with the patient and Jo [name] family.  Jo [name] fracture is amenable to nonoperative treatment.  Sling, nonweightbearing left upper extremity.  Follow-up in the office in 2 weeks for repeat radiographs.    Redell JINNY Shoals, MD [phone]    05/13/2023 12:50 PM

## 2023-05-14 ENCOUNTER — Inpatient Hospital Stay (HOSPITAL_COMMUNITY): Payer: 59

## 2023-05-14 ENCOUNTER — Other Ambulatory Visit: Payer: Self-pay

## 2023-05-14 LAB — BASIC METABOLIC PANEL
Anion gap: 9 (ref 5–15)
Anion gap: 8 (ref 5–15)
BUN: <5 mg/dL — ABNORMAL LOW (ref 6–20)
BUN: 5 mg/dL — ABNORMAL LOW (ref 6–20)
CO2: 21 mmol/L — ABNORMAL LOW (ref 22–32)
CO2: 21 mmol/L — ABNORMAL LOW (ref 22–32)
Calcium: 8.6 mg/dL — ABNORMAL LOW (ref 8.9–10.3)
Calcium: 8.6 mg/dL — ABNORMAL LOW (ref 8.9–10.3)
Chloride: 105 mmol/L (ref 98–111)
Chloride: 104 mmol/L (ref 98–111)
Creatinine, Ser: 0.7 mg/dL (ref 0.44–1.00)
Creatinine, Ser: 0.65 mg/dL (ref 0.44–1.00)
GFR, Estimated: >60 mL/min (ref >60)
GFR, Estimated: >60 mL/min (ref >60)
Glucose, Bld: 119 mg/dL — ABNORMAL HIGH (ref 70–99)
Glucose, Bld: 107 mg/dL — ABNORMAL HIGH (ref 70–99)
Potassium: 3.7 mmol/L (ref 3.5–5.1)
Potassium: 3.5 mmol/L (ref 3.5–5.1)
Sodium: 135 mmol/L (ref 135–145)
Sodium: 133 mmol/L — ABNORMAL LOW (ref 135–145)

## 2023-05-14 LAB — CBC
HCT: 35.3 % — ABNORMAL LOW (ref 36.0–46.0)
Hemoglobin: 11.9 g/dL — ABNORMAL LOW (ref 12.0–15.0)
MCH: 32.3 pg (ref 26.0–34.0)
MCHC: 33.7 g/dL (ref 30.0–36.0)
MCV: 95.9 fL (ref 80.0–100.0)
Platelets: 212 10*3/uL (ref 150–400)
RBC: 3.68 MIL/uL — ABNORMAL LOW (ref 3.87–5.11)
RDW: 11.4 % — ABNORMAL LOW (ref 11.5–15.5)
WBC: 8.9 10*3/uL (ref 4.0–10.5)
nRBC: 0 % (ref 0.0–0.2)

## 2023-05-14 MED ORDER — GABAPENTIN 300 MG PO CAPS
300.0000 mg | ORAL_CAPSULE | Freq: Three times a day (TID) | ORAL | Status: DC
  Administered 2023-05-14 – 2023-05-16 (×6): 300 mg via ORAL
  Filled 2023-05-14 (×6): qty 1

## 2023-05-14 MED ORDER — SIMETHICONE 80 MG PO CHEW
80.0000 mg | CHEWABLE_TABLET | Freq: Four times a day (QID) | ORAL | Status: DC | PRN
  Administered 2023-05-14: 80 mg via ORAL
  Filled 2023-05-14: qty 1

## 2023-05-14 MED ORDER — SODIUM CHLORIDE 0.9 % IV SOLN
12.5000 mg | Freq: Four times a day (QID) | INTRAVENOUS | Status: DC | PRN
  Administered 2023-05-14: 12.5 mg via INTRAVENOUS
  Filled 2023-05-14 (×2): qty 0.5

## 2023-05-14 MED ORDER — DIPHENHYDRAMINE HCL 50 MG/ML IJ SOLN
INTRAMUSCULAR | Status: AC
  Filled 2023-05-14: qty 1

## 2023-05-14 MED ORDER — CYCLOBENZAPRINE HCL 5 MG PO TABS
5.0000 mg | ORAL_TABLET | Freq: Three times a day (TID) | ORAL | Status: DC | PRN
  Administered 2023-05-14 – 2023-05-15 (×2): 5 mg via ORAL
  Filled 2023-05-14 (×2): qty 1

## 2023-05-14 MED ORDER — OXYCODONE HCL 5 MG PO TABS
10.0000 mg | ORAL_TABLET | ORAL | Status: DC | PRN
  Filled 2023-05-14: qty 2

## 2023-05-14 MED ORDER — IOHEXOL 350 MG/ML SOLN
75.0000 mL | Freq: Once | INTRAVENOUS | Status: AC | PRN
  Administered 2023-05-14: 75 mL via INTRAVENOUS

## 2023-05-14 MED ORDER — DIPHENHYDRAMINE HCL 50 MG/ML IJ SOLN
25.0000 mg | Freq: Once | INTRAMUSCULAR | Status: AC
  Administered 2023-05-14: 25 mg via INTRAVENOUS

## 2023-05-14 MED ORDER — CHLORHEXIDINE GLUCONATE CLOTH 2 % EX PADS
6.0000 | MEDICATED_PAD | Freq: Every day | CUTANEOUS | Status: DC
  Administered 2023-05-14 – 2023-05-18 (×5): 6 via TOPICAL

## 2023-05-14 MED ORDER — OXYCODONE HCL 5 MG PO TABS
15.0000 mg | ORAL_TABLET | ORAL | Status: DC | PRN
  Administered 2023-05-14 – 2023-05-18 (×23): 15 mg via ORAL
  Filled 2023-05-14 (×23): qty 3

## 2023-05-14 MED ORDER — FAMOTIDINE 20 MG PO TABS
20.0000 mg | ORAL_TABLET | Freq: Every day | ORAL | Status: DC
  Administered 2023-05-14 – 2023-05-18 (×5): 20 mg via ORAL
  Filled 2023-05-14 (×5): qty 1

## 2023-05-14 MED ORDER — IOHEXOL 12 MG/ML PO SOLN: 500.0000 mL | ORAL | Status: AC

## 2023-05-14 MED ORDER — HYDROMORPHONE HCL 1 MG/ML IJ SOLN
1.0000 mg | Freq: Once | INTRAMUSCULAR | Status: AC
  Administered 2023-05-14: 1 mg via INTRAVENOUS
  Filled 2023-05-14: qty 1

## 2023-05-14 MED ORDER — IOHEXOL 9 MG/ML PO SOLN
ORAL | Status: AC
  Filled 2023-05-14: qty 1000

## 2023-05-14 MED ORDER — SODIUM CHLORIDE 0.9% FLUSH: 10.0000 mL | INTRAVENOUS | Status: DC | PRN

## 2023-05-14 MED ORDER — SODIUM CHLORIDE 0.9% FLUSH
10.0000 mL | Freq: Two times a day (BID) | INTRAVENOUS | Status: DC
  Administered 2023-05-14 – 2023-05-17 (×7): 10 mL
  Administered 2023-05-18: 20 mL

## 2023-05-14 NOTE — Progress Notes (Signed)
 OT Cancellation Note  Patient Details Name: Jo [name] MRN: [id number] DOB: 08-16-1990   Cancelled Treatment:    Reason Eval/Treat Not Completed: Medical issues which prohibited therapy- Pt very nauseous. Therefore, RN unable to administer pain meds.RN in contact with MD for nausea meds. Also, pt with questionable small bowel mesenteric hematoma. RN reports due to continued nausea, pt may need to go for repeat CT. OT to hold eval.   Etta NOVAK, OT Acute Rehabilitation Services Office [phone]   Etta GORMAN Hope 05/14/2023, 12:56 PM

## 2023-05-14 NOTE — Progress Notes (Addendum)
  Subjective No acute events. Feeling fine - sore as expected. No new pains or issues reported.  Objective: Vital signs in last 24 hours: Temp:  [97.6 F (36.4 C)-99 F (37.2 C)] 99 F (37.2 C) (01/12 0700) Pulse Rate:  [72-96] 87 (01/12 0700) Resp:  [15-25] 17 (01/12 0700) BP: (106-145)/(67-94) 145/89 (01/12 0700) SpO2:  [94 %-100 %] 97 % (01/12 0700) Last BM Date : 05/12/23  Intake/Output from previous day: 01/11 0701 - 01/12 0700 In: -  Out: 1900 [Urine:800; Chest Tube:1100] Intake/Output this shift: No intake/output data recorded.  Gen: NAD, comfortable CV: RRR Pulm: Normal work of breathing - chest tube in place min ss output no air leak Abd: Soft, not significantly tender nor distended; no rebound/guarding Ext: SCDs in place  Lab Results: CBC  Recent Labs    05/12/23 2313 05/14/23 0408  WBC 13.6* 8.9  HGB 12.4 11.9*  HCT 35.4* 35.3*  PLT 236 212   BMET Recent Labs    05/12/23 2313 05/14/23 0408  NA 135 135  K 3.5 3.7  CL 105 105  CO2 20* 21*  GLUCOSE 118* 119*  BUN 15 <5*  CREATININE 0.79 0.70  CALCIUM 8.9 8.6*   PT/INR No results for input(s): LABPROT, INR in the last 72 hours. ABG No results for input(s): PHART, HCO3 in the last 72 hours.  Invalid input(s): PCO2, PO2  Studies/Results:  Anti-infectives: Anti-infectives (From admission, onward)    None        Assessment/Plan: Patient Active Problem List   Diagnosis Date Noted   Pneumothorax on left 05/13/2023   Liver injury, initial encounter 05/13/2023   S/P cesarean section 08/31/2020   Maternal care due to low transverse uterine scar from previous cesarean delivery 08/31/2020   [redacted] weeks gestation of pregnancy 03/26/2019   Sledding and struck a tree   Left rib fractures 1, 3, 4-6 - Multimodal pain control and pulmonary toilet Left pneumothorax - IR guided chest tube 1/11; cont to wall suction today Left scapular fracture -Dr. Fidel consulted;nonop mgmt planned  - NWB LUE; sling Grade 1 liver laceration Questionable small bowel mesenteric hematoma - abdomen is not significantly tender.  Will allow diet and follow exam. Multiple lumbar transverse process fractures -pain control and muscle relaxer  PPX: SCDs for now; will start chemical dvt ppx tomorrow if no further drifting of hemoglobin  I spent a total of 35 minutes in both face-to-face and non-face-to-face activities, excluding procedures performed, for this visit on the date of this encounter.    LOS: 1 day    Lonni Pizza, MD [facility]

## 2023-05-14 NOTE — Progress Notes (Addendum)
 Referring Physician(s): Trauma team   Supervising Physician: Philip Cornet  Patient Status:  [facility] - In-pt  Chief Complaint:  Left PXT S/p chest tube placement by Dr. Philip on 05/13/23   Subjective:  Patient laying in bed, NAD. Reports that she is doing ok, still in lot of pain.   Allergies: Patient has no known allergies.  Medications: Prior to Admission medications   Medication Sig Start Date End Date Taking? Authorizing Provider  albuterol (VENTOLIN HFA) 108 (90 Base) MCG/ACT inhaler Inhale 1-2 puffs into the lungs every 4 (four) hours as needed for wheezing or shortness of breath.   Yes [provider]  Jo [name] 24 FE 1-20 MG-MCG(24) tablet Take 1 tablet by mouth daily. 03/28/23  Yes [provider]  HYDROMET 5-1.5 MG/5ML syrup Take 5 mLs by mouth 2 (two) times daily as needed for cough. 04/10/23  Yes [provider]  Ibuprofen -Acetaminophen  (ADVIL  DUAL ACTION) 125-250 MG TABS Take 1-2 tablets by mouth 2 (two) times daily as needed (Pain).   Yes [provider]     Vital Signs: BP (!) 145/89   Pulse 87   Temp 97.6 F (36.4 C)   Resp 17   Ht 5' 6 (1.676 m)   Wt 142 lb (64.4 kg)   SpO2 97%   BMI 22.92 kg/m   Physical Exam Vitals reviewed.  Constitutional:      General: She is not in acute distress.    Appearance: She is not ill-appearing.  HENT:     Head: Normocephalic and atraumatic.  Pulmonary:     Effort: Pulmonary effort is normal.     Comments: Positive Left chest tube a PleurEvac. Site is unremarkable with no se emphysema, erythema, edema, tenderness, bleeding or drainage. Suture and stat lock in place. Dressing is clean, dry, and intact. Serosanguinous fluid noted in the PleurEvac. No air leak.  Skin:    General: Skin is warm and dry.     Coloration: Skin is not jaundiced or pale.  Neurological:     Mental Status: She is alert.  Psychiatric:        Mood and Affect: Mood normal.        Behavior: Behavior normal.      Imaging: CT PERC PLEURAL DRAIN W/INDWELL CATH W/IMG GUIDE Result Date: 05/13/2023 INDICATION: 33 year old with a post traumatic left pneumothorax. EXAM: CT-GUIDED PLACEMENT OF LEFT CHEST TUBE TECHNIQUE: Multidetector CT imaging of the chest was performed following the standard protocol with/without IV contrast. RADIATION DOSE REDUCTION: This exam was performed according to the departmental dose-optimization program which includes automated exposure control, adjustment of the mA and/or kV according to patient size and/or use of iterative reconstruction technique. MEDICATIONS: 100 mcg fentanyl  ANESTHESIA/SEDATION: The patient's level of consciousness and vital signs were monitored continuously by radiology nursing throughout the procedure under my direct supervision. COMPLICATIONS: None immediate. PROCEDURE: Informed written consent was obtained from the patient after a thorough discussion of the procedural risks, benefits and alternatives. All questions were addressed. Maximal Sterile Barrier Technique was utilized including caps, mask, sterile gowns, sterile gloves, sterile drape, hand hygiene and skin antiseptic. A timeout was performed prior to the initiation of the procedure. Patient was placed on her right side. CT images through the chest were obtained. Large left pneumothorax was identified. The left mid axillary region was prepped and draped in sterile fashion. Skin was anesthetized with 1% lidocaine . Small incision was made. Using CT guidance, a Yueh catheter was directed into the pleural space and air  was aspirated. Superstiff Amplatz wire was placed. The tract was dilated to accommodate a 14 French multipurpose drain. Drain was attached to a chest tube evacuation system. Tube was placed to wall suction and follow up CT images were obtained. Chest tube was sutured in place. Dressing was placed. FINDINGS: Large left pneumothorax. Following placement of the 100 French drain, the pneumothorax was  nearly resolved. IMPRESSION: CT-guided placement of a left chest tube. Electronically Signed   By: Juliene Balder M.D.   On: 05/13/2023 17:59   DG Shoulder Left Port Result Date: 05/13/2023 CLINICAL DATA:  Close LEFT scapular fracture. EXAM: LEFT SHOULDER COMPARISON:  CT 05/13/2023 FINDINGS: Subtle nondisplaced LEFT scapular fracture identified on CT is barely evident is a linear lucency through the body of the scapula. Glenohumeral joint is intact. No evidence of or humeral fracture. The acromioclavicular joint is intact. Displaced posterior lateral third rib fracture noted IMPRESSION: 1. Nondisplaced scapular fracture. 2. No shoulder dislocation. Electronically Signed   By: Jackquline Boxer M.D.   On: 05/13/2023 13:40   DG CHEST PORT 1 VIEW Result Date: 05/13/2023 CLINICAL DATA:  Pneumothorax EXAM: PORTABLE CHEST 1 VIEW COMPARISON:  Same day CT FINDINGS: Small-moderate-sized left-sided pneumothorax. Very slight rightward deviation of the heart and mediastinum concerning for developing tension morphology. Heart size is normal. Both lungs are clear. No right-sided pneumothorax. Left-sided rib fractures including displaced posterior left third rib fracture. No subcutaneous emphysema. IMPRESSION: Small-moderate-sized left-sided pneumothorax with findings concerning for developing tension morphology. These results will be called to the ordering clinician or representative by the Radiologist Assistant, and communication documented in the PACS or [employer]. Electronically Signed   By: Mabel Converse D.O.   On: 05/13/2023 10:54   CT Cervical Spine Wo Contrast Result Date: 05/13/2023 CLINICAL DATA:  Presents with neck pain following a sledding accident. EXAM: CT CERVICAL SPINE WITHOUT CONTRAST TECHNIQUE: Multidetector CT imaging of the cervical spine was performed without intravenous contrast. Multiplanar CT image reconstructions were also generated. RADIATION DOSE REDUCTION: This exam was performed according  to the departmental dose-optimization program which includes automated exposure control, adjustment of the mA and/or kV according to patient size and/or use of iterative reconstruction technique. COMPARISON:  Contemporaneous chest, abdomen and pelvis CT. FINDINGS: Technical note: The patient was scanned right lateral decubitus due to condition. Alignment: Normal. Skull base and vertebrae: No acute fracture is evident. No primary bone lesion or focal pathologic process in the cervical spine. Soft tissues and spinal canal: No prevertebral fluid or swelling. No visible canal hematoma. Disc levels: There is preservation of the normal cervical disc heights. No herniated discs, cord compromise or other significant soft tissue or bony encroachment on the thecal sac is seen. Arthritic changes are not seen. The foramina are clear. Early endplate spurring is beginning to develop anteriorly at C4-5 and C5-6. Upper chest: Left apical pneumothorax is againNoted as well as a nondisplaced posterior left first rib fracture. Other: Bilaterally impacted mandibular wisdom teeth. IMPRESSION: 1. No evidence of cervical fractures or malalignment. 2. Early endplate spurring anteriorly at C4-5 and C5-6. 3. Left apical pneumothorax and nondisplaced posterior left first rib fracture. See chest CT report. Electronically Signed   By: Francis Quam M.D.   On: 05/13/2023 03:41   CT CHEST ABDOMEN PELVIS W CONTRAST Result Date: 05/13/2023 CLINICAL DATA:  Polytrauma, blunt EXAM: CT CHEST, ABDOMEN, AND PELVIS WITH CONTRAST TECHNIQUE: Multidetector CT imaging of the chest, abdomen and pelvis was performed following the standard protocol during bolus administration of intravenous contrast.  RADIATION DOSE REDUCTION: This exam was performed according to the departmental dose-optimization program which includes automated exposure control, adjustment of the mA and/or kV according to patient size and/or use of iterative reconstruction technique.  CONTRAST:  OMNIPAQUE  IOHEXOL  300 MG/ML  SOLN COMPARISON:  None Available. FINDINGS: CHEST: Cardiovascular: No aortic injury. The thoracic aorta is normal in caliber. The heart is normal in size. No significant pericardial effusion. Mediastinum/Nodes: No pneumomediastinum. No mediastinal hematoma. The esophagus is unremarkable. The thyroid is unremarkable. The central airways are patent. No mediastinal, hilar, or axillary lymphadenopathy. Lungs/Pleura: No focal consolidation. No pulmonary nodule. No pulmonary mass. No pulmonary contusion or laceration. No pneumatocele formation. No pleural effusion. Small to moderate volume left pneumothorax. No right pneumothorax. No hemothorax. Musculoskeletal/Chest wall: No chest wall mass. Acute minimally displaced posterior left 1st rib fracture. Acute displaced left anterior 3rd rib fractures. Acute nondisplaced left anterior 4-6 rib fractures. No acute displaced sternal fracture. Likely acute nondisplaced left scapular fracture (2:19). No spinal fracture. ABDOMEN / PELVIS: Hepatobiliary: Not enlarged. A 2.2 x 1 x 4 cm subcapsular hypodensity along the left medial hepatic lobe (2:52). The gallbladder is otherwise unremarkable with no radio-opaque gallstones. No biliary ductal dilatation. Pancreas: Normal pancreatic contour. No main pancreatic duct dilatation. Spleen: Not enlarged. No focal lesion. No laceration, subcapsular hematoma, or vascular injury. Adrenals/Urinary Tract: No nodularity bilaterally. Bilateral kidneys enhance symmetrically. No hydronephrosis. No contusion, laceration, or subcapsular hematoma. No injury to the vascular structures or collecting systems. No hydroureter. The urinary bladder is unremarkable. On delayed imaging, there is no urothelial wall thickening and there are no filling defects in the opacified portions of the bilateral collecting systems or ureters. Stomach/Bowel: No small or large bowel wall thickening or dilatation. The appendix is  unremarkable. Vasculature/Lymphatics: No abdominal aorta or iliac aneurysm. No active contrast extravasation or pseudoaneurysm. No abdominal, pelvic, inguinal lymphadenopathy. Reproductive: Uterus and bilateral adnexal regions are unremarkable. Other: No simple free fluid ascites. No pneumoperitoneum. No hemoperitoneum. Question vague haziness of the small bowel mesentery with several loops of associated fluid-filled small bowel (not dilated) (2:85-90). No organized fluid collection. Musculoskeletal: Small fat containing umbilical hernia. Mild subcutaneus soft tissue hematoma along the lower back. Irregularity and asymmetric enlargement of the left psoas muscle with associated hypodense heterogeneity (2:87). No acute pelvic fracture. Right posterior acetabular lytic lesion likely of benign etiology. Please see separately dictated CT lumbar spine 05/13/2023. Ports and Devices: None. IMPRESSION: 1. Small to moderate volume pneumothorax. 2. A 2.2 x 1 x 4 cm left hepatic lobe subcapsular hematoma. 3. Left psoas intramuscular hematoma with retroperitoneal hemorrhage. 4. Developing small bowel mesenteric hematoma not fully excluded. Recommend serial abdominal exam and follow-up. 5. Acute minimally displaced posterior left 1st rib fracture. Acute displaced left anterior 3rd rib fractures. Acute nondisplaced left anterior 4-6 rib fractures. 6. Likely acute nondisplaced left scapular fracture 7. No acute fracture or traumatic malalignment of the thoracic spine. 8. Please see separately dictated CT lumbar spine 05/13/2023. 9. Other imaging findings of potential clinical significance: Right posterior acetabular lytic lesion likely of benign etiology. Correlation with prior radiography or cross-sectional imaging would be of value. These results were called by telephone at the time of interpretation on 05/13/2023 at 1:36 am to provider VICENTA ABLE , who verbally acknowledged these results. Electronically Signed   By: Morgane   Naveau M.D.   On: 05/13/2023 01:44   CT L-SPINE NO CHARGE Result Date: 05/13/2023 CLINICAL DATA:  Lumbar radiculopathy, trauma EXAM: CT LUMBAR SPINE WITHOUT CONTRAST TECHNIQUE: Multidetector  CT imaging of the lumbar spine was performed without intravenous contrast administration. Multiplanar CT image reconstructions were also generated. RADIATION DOSE REDUCTION: This exam was performed according to the departmental dose-optimization program which includes automated exposure control, adjustment of the mA and/or kV according to patient size and/or use of iterative reconstruction technique. COMPARISON:  CT chest abdomen pelvis 05/13/2023 FINDINGS: Segmentation: 5 lumbar type vertebrae. Alignment: Normal. Vertebrae: Acute displaced left L1, L2, L3, L4 transverse process fractures. No focal pathologic process. Paraspinal and other soft tissues: Left psoas intramuscular hematoma with retroperitoneal hemorrhage. Disc levels: Maintained. IMPRESSION: Acute displaced left L1, L2, L3, L4 transverse process fractures. Associated left psoas intramuscular hematoma with retroperitoneal hemorrhage. Electronically Signed   By: Morgane  Naveau M.D.   On: 05/13/2023 01:42    Labs:  CBC: Recent Labs    05/12/23 2313 05/14/23 0408  WBC 13.6* 8.9  HGB 12.4 11.9*  HCT 35.4* 35.3*  PLT 236 212    COAGS: No results for input(s): INR, APTT in the last 8760 hours.  BMP: Recent Labs    05/12/23 2313 05/14/23 0408  NA 135 135  K 3.5 3.7  CL 105 105  CO2 20* 21*  GLUCOSE 118* 119*  BUN 15 <5*  CALCIUM 8.9 8.6*  CREATININE 0.79 0.70  GFRNONAA >60 >60    LIVER FUNCTION TESTS: No results for input(s): BILITOT, AST, ALT, ALKPHOS, PROT, ALBUMIN in the last 8760 hours.  Assessment and Plan:  33 y.o. female with sledding injury, multiple fx, grade 1 liver laceration and left PTX, s/p left chest tube placement by Dr. Philip on 05/13/23.   VSS CBC stable  Chest tube output documented 1,100 mL  overnight but not much fluid in the collecting system this morning  No appreciable air leak, no sq emphysema at the insertion site.  CXR this morning reviewed with Dr. Philip, there is still small PTX.   Recommend continue wall suction and daily CXR. CXR for tomorrow ordered.   Further treatment plan per trauma team  Appreciate and agree with the plan.  IR to follow.    Electronically Signed: Toya VEAR Cousin, PA-C 05/14/2023, 9:03 AM   I spent a total of 15 Minutes at the the patient's bedside AND on the patient's hospital floor or unit, greater than 50% of which was counseling/coordinating care for left chest tube f/u.   This chart was dictated using voice recognition software.  Despite best efforts to proofread,  errors can occur which can change the documentation meaning.

## 2023-05-14 NOTE — Progress Notes (Signed)
 PT Cancellation Note  Patient Details Name: Jo [name] MRN: [id number] DOB: 04-24-1991   Cancelled Treatment:    Reason Eval/Treat Not Completed: Medical issues which prohibited therapy. Pt very nauseous. Therefore, RN unable to administer pain meds.RN in contact with MD for nausea meds. Also, pt with questionable small bowel mesenteric hematoma. RN reports due to continued nausea, pt may need to go for repeat CT. PT to hold eval.   Erven Sari Shaker 05/14/2023, 12:50 PM

## 2023-05-14 NOTE — Progress Notes (Signed)
 Peripherally Inserted Central Catheter Placement  The IV Nurse has discussed with the patient and/or persons authorized to consent for the patient, the purpose of this procedure and the potential benefits and risks involved with this procedure.  The benefits include less needle sticks, lab draws from the catheter, and the patient may be discharged home with the catheter. Risks include, but not limited to, infection, bleeding, blood clot (thrombus formation), and puncture of an artery; nerve damage and irregular heartbeat and possibility to perform a PICC exchange if needed/ordered by physician.  Alternatives to this procedure were also discussed.  Bard Power PICC patient education guide, fact sheet on infection prevention and patient information card has been provided to patient /or left at bedside.  Koren, sister at the bedside.  PICC Placement Documentation  PICC Single Lumen 05/14/23 Right Basilic 33 cm 0 cm (Active)  Indication for Insertion or Continuance of Line Poor Vasculature-patient has had multiple peripheral attempts or PIVs lasting less than 24 hours 05/14/23 1817  Exposed Catheter (cm) 0 cm 05/14/23 1817  Site Assessment Clean, Dry, Intact 05/14/23 1817  Line Status Flushed;Saline locked;Blood return noted 05/14/23 1817  Dressing Type Transparent;Securing device 05/14/23 1817  Dressing Status Antimicrobial disc/dressing in place;Clean, Dry, Intact 05/14/23 1817  Line Care Connections checked and tightened 05/14/23 1817  Line Adjustment (NICU/IV Team Only) No 05/14/23 1817  Dressing Intervention New dressing;Adhesive placed at insertion site (IV team only);Adhesive placed around edges of dressing (IV team/ICU RN only) 05/14/23 1817  Dressing Change Due 05/21/23 05/14/23 1817       Matilde Zebedee Silvan 05/14/2023, 6:17 PM

## 2023-05-14 NOTE — TOC CAGE-AID Note (Signed)
 Transition of Care [facility]) - CAGE-AID Screening  Patient Details  Name: Jo [name] MRN: [id number] Date of Birth: [birth year]  Clinical Narrative:  Patient denies any drug use or cigarettes. Patient does endorse very occasional alcohol use, denies need for substance abuse education.  CAGE-AID Screening:   Have You Ever Felt You Ought to Cut Down on Your Drinking or Drug Use?: No Have People Annoyed You By Critizing Your Drinking Or Drug Use?: No Have You Felt Bad Or Guilty About Your Drinking Or Drug Use?: No Have You Ever Had a Drink or Used Drugs First Thing In The Morning to Steady Your Nerves or to Get Rid of a Hangover?: No CAGE-AID Score: 0  Substance Abuse Education Offered: Yes

## 2023-05-15 ENCOUNTER — Inpatient Hospital Stay (HOSPITAL_COMMUNITY): Payer: 59

## 2023-05-15 LAB — CBC WITH DIFFERENTIAL/PLATELET
Abs Immature Granulocytes: 0.03 10*3/uL (ref 0.00–0.07)
Basophils Absolute: 0 10*3/uL (ref 0.0–0.1)
Basophils Relative: 1 %
Eosinophils Absolute: 0.2 10*3/uL (ref 0.0–0.5)
Eosinophils Relative: 3 %
HCT: 34.8 % — ABNORMAL LOW (ref 36.0–46.0)
Hemoglobin: 11.9 g/dL — ABNORMAL LOW (ref 12.0–15.0)
Immature Granulocytes: 0 %
Lymphocytes Relative: 35 %
Lymphs Abs: 2.4 10*3/uL (ref 0.7–4.0)
MCH: 32.6 pg (ref 26.0–34.0)
MCHC: 34.2 g/dL (ref 30.0–36.0)
MCV: 95.3 fL (ref 80.0–100.0)
Monocytes Absolute: 0.6 10*3/uL (ref 0.1–1.0)
Monocytes Relative: 8 %
Neutro Abs: 3.6 10*3/uL (ref 1.7–7.7)
Neutrophils Relative %: 53 %
Platelets: 229 10*3/uL (ref 150–400)
RBC: 3.65 MIL/uL — ABNORMAL LOW (ref 3.87–5.11)
RDW: 11.5 % (ref 11.5–15.5)
WBC: 6.8 10*3/uL (ref 4.0–10.5)
nRBC: 0 % (ref 0.0–0.2)

## 2023-05-15 LAB — HCG, QUANTITATIVE, PREGNANCY: hCG, Beta Chain, Quant, S: 57 m[IU]/mL — ABNORMAL HIGH (ref <5)

## 2023-05-15 MED ORDER — ONDANSETRON 4 MG PO TBDP
4.0000 mg | ORAL_TABLET | ORAL | Status: DC | PRN
  Administered 2023-05-15 (×2): 4 mg via ORAL
  Filled 2023-05-15 (×2): qty 1

## 2023-05-15 MED ORDER — KETOROLAC TROMETHAMINE 15 MG/ML IJ SOLN
30.0000 mg | Freq: Four times a day (QID) | INTRAMUSCULAR | Status: DC
  Administered 2023-05-15 – 2023-05-18 (×13): 30 mg via INTRAVENOUS
  Filled 2023-05-15 (×13): qty 2

## 2023-05-15 MED ORDER — ONDANSETRON HCL 4 MG/2ML IJ SOLN
4.0000 mg | INTRAMUSCULAR | Status: DC | PRN
  Administered 2023-05-15 – 2023-05-16 (×3): 4 mg via INTRAVENOUS
  Filled 2023-05-15 (×3): qty 2

## 2023-05-15 MED ORDER — ENOXAPARIN SODIUM 30 MG/0.3ML IJ SOSY
30.0000 mg | PREFILLED_SYRINGE | Freq: Two times a day (BID) | INTRAMUSCULAR | Status: DC
  Administered 2023-05-15 – 2023-05-18 (×6): 30 mg via SUBCUTANEOUS
  Filled 2023-05-15 (×6): qty 0.3

## 2023-05-15 MED ORDER — METHOCARBAMOL 500 MG PO TABS
1000.0000 mg | ORAL_TABLET | Freq: Once | ORAL | Status: AC
  Administered 2023-05-15: 1000 mg via ORAL
  Filled 2023-05-15: qty 2

## 2023-05-15 MED ORDER — POLYETHYLENE GLYCOL 3350 17 G PO PACK
17.0000 g | PACK | Freq: Every day | ORAL | Status: DC
  Administered 2023-05-15 – 2023-05-16 (×2): 17 g via ORAL
  Filled 2023-05-15 (×2): qty 1

## 2023-05-15 MED ORDER — KETOROLAC TROMETHAMINE 15 MG/ML IJ SOLN: 30.0000 mg | Freq: Four times a day (QID) | INTRAMUSCULAR | Status: DC

## 2023-05-15 MED ORDER — METHOCARBAMOL 500 MG PO TABS
1000.0000 mg | ORAL_TABLET | Freq: Four times a day (QID) | ORAL | Status: DC
  Administered 2023-05-15 – 2023-05-18 (×12): 1000 mg via ORAL
  Filled 2023-05-15 (×12): qty 2

## 2023-05-15 MED ORDER — LIDOCAINE 5 % EX PTCH
2.0000 | MEDICATED_PATCH | CUTANEOUS | Status: DC
  Administered 2023-05-15: 2 via TRANSDERMAL
  Administered 2023-05-16: 1 via TRANSDERMAL
  Administered 2023-05-17 – 2023-05-18 (×2): 2 via TRANSDERMAL
  Filled 2023-05-15 (×4): qty 2

## 2023-05-15 MED ORDER — METHOCARBAMOL 500 MG PO TABS: 1000.0000 mg | ORAL_TABLET | Freq: Three times a day (TID) | ORAL | Status: DC

## 2023-05-15 MED ORDER — DIPHENHYDRAMINE HCL 12.5 MG/5ML PO ELIX
25.0000 mg | ORAL_SOLUTION | ORAL | Status: DC | PRN
  Administered 2023-05-15 – 2023-05-16 (×2): 25 mg via ORAL
  Filled 2023-05-15 (×2): qty 10

## 2023-05-15 NOTE — Progress Notes (Signed)
 Trauma/Critical Care Follow Up Note  Subjective:    Overnight Issues:   Objective:  Vital signs for last 24 hours: Temp:  [98 F (36.7 C)-98.6 F (37 C)] 98.6 F (37 C) (01/13 1106) Pulse Rate:  [77-93] 85 (01/13 1106) Resp:  [10-16] 16 (01/13 1106) BP: (103-121)/(62-94) 113/76 (01/13 1106) SpO2:  [92 %-97 %] 97 % (01/13 1106)  Hemodynamic parameters for last 24 hours:    Intake/Output from previous day: 01/12 0701 - 01/13 0700 In: 61.7 [I.V.:13; IV Piggyback:48.7] Out: 26 [Chest Tube:26]  Intake/Output this shift: Total I/O In: 720 [P.O.:720] Out: -   Vent settings for last 24 hours:    Physical Exam:  Gen: comfortable, no distress Neuro: follows commands, alert, communicative HEENT: PERRL Neck: supple CV: RRR Pulm: unlabored breathing on RA Abd: soft, NT   ,    GU: urine clear and yellow, +spontaneous voids Extr: wwp, no edema  Results for orders placed or performed during the hospital encounter of 05/12/23 (from the past 24 hours)  CBC with Differential/Platelet     Status: Abnormal   Collection Time: 05/15/23  5:21 AM  Result Value Ref Range   WBC 6.8 4.0 - 10.5 K/uL   RBC 3.65 (L) 3.87 - 5.11 MIL/uL   Hemoglobin 11.9 (L) 12.0 - 15.0 g/dL   HCT 65.1 (L) 63.9 - 53.9 %   MCV 95.3 80.0 - 100.0 fL   MCH 32.6 26.0 - 34.0 pg   MCHC 34.2 30.0 - 36.0 g/dL   RDW 88.4 88.4 - 84.4 %   Platelets 229 150 - 400 K/uL   nRBC 0.0 0.0 - 0.2 %   Neutrophils Relative % 53 %   Neutro Abs 3.6 1.7 - 7.7 K/uL   Lymphocytes Relative 35 %   Lymphs Abs 2.4 0.7 - 4.0 K/uL   Monocytes Relative 8 %   Monocytes Absolute 0.6 0.1 - 1.0 K/uL   Eosinophils Relative 3 %   Eosinophils Absolute 0.2 0.0 - 0.5 K/uL   Basophils Relative 1 %   Basophils Absolute 0.0 0.0 - 0.1 K/uL   Immature Granulocytes 0 %   Abs Immature Granulocytes 0.03 0.00 - 0.07 K/uL    Assessment & Plan: The plan of care was discussed with the bedside nurse for the day, who is in agreement with this plan  and no additional concerns were raised.   Present on Admission:  Pneumothorax on left  Liver injury, initial encounter    LOS: 2 days   Additional comments:I reviewed the patient's new clinical lab test results.   and I reviewed the patients new imaging test results.    Sledding and struck a tree   Left rib fractures 1, 3, 4-6 - Multimodal pain control and pulmonary toilet Left pneumothorax - IR guided chest tube 1/11; no PTX on CXR, WS today Left scapular fracture -Dr. Fidel consulted;nonop mgmt planned - NWB LUE; sling Grade 1 liver laceration Questionable small bowel mesenteric hematoma - abdomen is not significantly tender.  Will allow diet and follow exam. Multiple lumbar transverse process fractures -pain control and muscle relaxer +UPT and bhCG(quant) - will resend today FEN - reg diet PPX: SCDs for now; LMWH Dispo - 4NP  Adithi Gammon N. Dawt Reeb, MD Trauma & General Surgery Please use [email or web address] to contact on call provider  05/15/2023  *Care during the described time interval was provided by me. I have reviewed this patient's available data, including medical history, events of note, physical examination and test results as part  of my evaluation.

## 2023-05-15 NOTE — Evaluation (Signed)
 Physical Therapy Evaluation Patient Details Name: Jo [name] MRN: [id number] DOB: 06-17-1990 Today's Date: 05/15/2023  History of Present Illness  33 yo female s/p sledding accident striking tree, sustained L rib fx 1, 3, 4-6; L PTX, L scapular fx in sling, grade 1 liver laceration, questionable small bowel mesenteric hematoma, and multiple lumbar TVP fx. S/P L chest tube placement 1/11. PMH unremarkable.  Clinical Impression  Patient presents with decreased mobility due to pain, decreased activity tolerance, decreased strength and balance.  Previously independent working and caring for her family.  She needed +2 max A for up to EOB and to attempt to stand though did not tolerate with L side rib and back pain.  Patient will benefit from skilled PT in the acute setting and from follow up HHPT at d/c.  Has family support though pain limited and likely would not tolerate inpatient rehab, so may need a little longer acute stay till pain controlled to allow mobility.         If plan is discharge home, recommend the following: A lot of help with bathing/dressing/bathroom;A lot of help with walking and/or transfers;Help with stairs or ramp for entrance;Assistance with cooking/housework;Assist for transportation   Can travel by private vehicle        Equipment Recommendations Rolling walker (2 wheels);BSC/3in1  Recommendations for Other Services       Functional Status Assessment Patient has had a recent decline in their functional status and demonstrates the ability to make significant improvements in function in a reasonable and predictable amount of time.     Precautions / Restrictions Precautions Precautions: Fall Precaution Comments: L chest tube, rib and lumber TVP fx's on L Required Braces or Orthoses: Sling Restrictions Weight Bearing Restrictions Per Provider Order: Yes LUE Weight Bearing Per Provider Order: Non weight bearing      Mobility  Bed Mobility Overal bed  mobility: Needs Assistance Bed Mobility: Supine to Sit, Sit to Supine     Supine to sit: Mod assist, +2 for safety/equipment, Used rails, HOB elevated Sit to supine: Mod assist, HOB elevated, Used rails   General bed mobility comments: cues for sequencing throughout, assist with RLE, trunk elevation/descent with increased time, use of rail, HOB elevation, and use of bed pad to facilitate hips EOB; to supine again with HOB elevation, assist for legs into bed and for trunk to surface of bed then to scoot up in bed    Transfers Overall transfer level: Needs assistance Equipment used: 2 person hand held assist Transfers: Sit to/from Stand Sit to Stand: Max assist, +2 physical assistance, +2 safety/equipment           General transfer comment: unable to come to full upright despite max cues and assist. Pt yelling out in pain in lower back and immediately crying HR jumped into high 140's (up to 150)    Ambulation/Gait                  Stairs            Wheelchair Mobility     Tilt Bed    Modified Rankin (Stroke Patients Only)       Balance Overall balance assessment: Needs assistance Sitting-balance support: Feet supported Sitting balance-Leahy Scale: Poor Sitting balance - Comments: limited by pain; heavily reliant on R UE support Postural control: Right lateral lean Standing balance support: Single extremity supported Standing balance-Leahy Scale: Zero Standing balance comment: unable to come to full upright  Pertinent Vitals/Pain Pain Assessment Pain Assessment: 0-10 Pain Score: 10-Worst pain ever Pain Location: lower back, L scapula Pain Descriptors / Indicators: Discomfort, Grimacing, Crying, Sharp, Stabbing Pain Intervention(s): Limited activity within patient's tolerance, Monitored during session, RN gave pain meds during session, Repositioned    Home Living Family/patient expects to be discharged to::  Private residence Living Arrangements: Children (4&2) Available Help at Discharge: Family;Available 24 hours/day Type of Home: House Home Access: Stairs to enter   [employer] of Steps: 5   Home Layout: One level Home Equipment: None Additional Comments: Mother plans to stay with her after discharge    Prior Function Prior Level of Function : Independent/Modified Independent             Mobility Comments: no DME ADLs Comments: completely independent, works in education officer, environmental for a systems analyst, drives     Extremity/Trunk Assessment   Upper Extremity Assessment Upper Extremity Assessment: Defer to OT evaluation    Lower Extremity Assessment Lower Extremity Assessment: LLE deficits/detail LLE Deficits / Details: AAROM WFL, strength hip flexion 2/5 (pain limited), knee extension 3+/5    Cervical / Trunk Assessment Cervical / Trunk Assessment: Other exceptions Cervical / Trunk Exceptions: L rib fx 1, 3, 4-6; L1-4 TVP fx, L chest tube  Communication   Communication Communication: No apparent difficulties  Cognition Arousal: Alert Behavior During Therapy: Anxious Overall Cognitive Status: Within Functional Limits for tasks assessed                                 General Comments: tearful but appropriately with pain during mobility; attempting to direct her care appropriately        General Comments General comments (skin integrity, edema, etc.): family in the room; RN delivered pain meds once supine; educated on process and pain management for allowing mobility to discharge    Exercises     Assessment/Plan    PT Assessment Patient needs continued PT services  PT Problem List Decreased strength;Pain;Decreased mobility;Decreased activity tolerance;Decreased knowledge of use of DME       PT Treatment Interventions DME instruction;Therapeutic activities;Therapeutic exercise;Patient/family education;Stair  training;Balance training;Functional mobility training;Gait training    PT Goals (Current goals can be found in the Care Plan section)  Acute Rehab PT Goals Patient Stated Goal: return to independent/home PT Goal Formulation: With patient/family Time For Goal Achievement: 05/29/23 Potential to Achieve Goals: Good    Frequency Min 1X/week     Co-evaluation PT/OT/SLP Co-Evaluation/Treatment: Yes Reason for Co-Treatment: For patient/therapist safety;To address functional/ADL transfers PT goals addressed during session: Mobility/safety with mobility;Balance OT goals addressed during session: Strengthening/ROM       AM-PAC PT 6 Clicks Mobility  Outcome Measure Help needed turning from your back to your side while in a flat bed without using bedrails?: Total Help needed moving from lying on your back to sitting on the side of a flat bed without using bedrails?: Total Help needed moving to and from a bed to a chair (including a wheelchair)?: Total Help needed standing up from a chair using your arms (e.g., wheelchair or bedside chair)?: Total Help needed to walk in hospital room?: Total Help needed climbing 3-5 steps with a railing? : Total 6 Click Score: 6    End of Session Equipment Utilized During Treatment: Other (comment) (chest tube) Activity Tolerance: Patient limited by pain Patient left: in bed;with call bell/phone within reach;with family/visitor present Nurse Communication: Mobility status;Patient  requests pain meds PT Visit Diagnosis: Difficulty in walking, not elsewhere classified (R26.2);Pain Pain - Right/Left: Left Pain - part of body:  (trunk and back)    Time: 8863-8794 PT Time Calculation (min) (ACUTE ONLY): 29 min   Charges:   PT Evaluation $PT Eval Moderate Complexity: 1 Mod   PT General Charges $$ ACUTE PT VISIT: 1 Visit         Micheline Portal, PT Acute Rehabilitation Services [phone] 05/15/2023   Montie Portal 05/15/2023, 4:58 PM

## 2023-05-15 NOTE — Progress Notes (Signed)
 Referring Physician(s): Dr JAYSON Pizza  Supervising Physician: Hughes Simmonds  Patient Status:  [facility] - In-pt  Chief Complaint:  Left PTX  Subjective:  Chest tube placed in IR 05/13/23 Pt feeling some better Chest tube is intact OP minimal in Pleurvac No air leak  CXR today:  IMPRESSION: 1. No residual pneumothorax identified. Stable left pleural pigtail catheter. 2. Probable small left pleural effusion with mild left basilar atelectasis.    Allergies: Patient has no known allergies.  Medications: Prior to Admission medications   Medication Sig Start Date End Date Taking? Authorizing Provider  albuterol (VENTOLIN HFA) 108 (90 Base) MCG/ACT inhaler Inhale 1-2 puffs into the lungs every 4 (four) hours as needed for wheezing or shortness of breath.   Yes [provider]  HAILEY 24 FE 1-20 MG-MCG(24) tablet Take 1 tablet by mouth daily. 03/28/23  Yes [provider]  HYDROMET 5-1.5 MG/5ML syrup Take 5 mLs by mouth 2 (two) times daily as needed for cough. 04/10/23  Yes [provider]  Ibuprofen -Acetaminophen  (ADVIL  DUAL ACTION) 125-250 MG TABS Take 1-2 tablets by mouth 2 (two) times daily as needed (Pain).   Yes [provider]     Vital Signs: BP 113/76 (BP Location: Left Arm)   Pulse 85   Temp 98.6 F (37 C) (Oral)   Resp 16   Ht 5' 6 (1.676 m)   Wt 142 lb (64.4 kg)   SpO2 97%   BMI 22.92 kg/m   Physical Exam Pulmonary:     Effort: Pulmonary effort is normal.     Breath sounds: No wheezing.  Skin:    General: Skin is warm.     Comments: Site clean and dry NT No air leak OP minimal in Pleurvac  Neurological:     Mental Status: She is alert.     Imaging: DG CHEST PORT 1 VIEW Result Date: 05/15/2023 CLINICAL DATA:  Follow up pneumothorax. EXAM: PORTABLE CHEST 1 VIEW COMPARISON:  Radiographs 05/14/2023 and 05/13/2023.  CT 05/13/2023. FINDINGS: 0600 hours. Small caliber left pleural pigtail catheter appears unchanged,  projecting over the cardiac apex and left hemidiaphragm. No residual pneumothorax identified. Probable small left pleural effusion with mild left basilar atelectasis. The heart size and mediastinal contours are stable. A right arm PICC has been placed, projecting to the superior cavoatrial junction. Left-sided rib fractures again noted. IMPRESSION: 1. No residual pneumothorax identified. Stable left pleural pigtail catheter. 2. Probable small left pleural effusion with mild left basilar atelectasis. 3. Interval placement of right arm PICC. Electronically Signed   By: Elsie Perone M.D.   On: 05/15/2023 10:00   US  EKG SITE RITE Result Date: 05/14/2023 If Site Rite image not attached, placement could not be confirmed due to current cardiac rhythm.  CT ABDOMEN PELVIS W CONTRAST Result Date: 05/14/2023 CLINICAL DATA:  Blunt abdominal trauma. Follow-up hepatic, mesenteric, and retroperitoneal hematomas. EXAM: CT ABDOMEN AND PELVIS WITH CONTRAST TECHNIQUE: Multidetector CT imaging of the abdomen and pelvis was performed using the standard protocol following bolus administration of intravenous contrast. RADIATION DOSE REDUCTION: This exam was performed according to the departmental dose-optimization program which includes automated exposure control, adjustment of the mA and/or kV according to patient size and/or use of iterative reconstruction technique. CONTRAST:  75mL OMNIPAQUE  IOHEXOL  350 MG/ML SOLN COMPARISON:  05/13/2023 FINDINGS: Lower Chest: Left chest tube is seen in place with tiny residual pneumothorax in left lung base. Hepatobiliary: Stable small subcapsular hematoma along the anterior left hepatic lobe measuring 2 x  1 cm. No other hepatic parenchymal injury identified. Gallbladder is unremarkable. No evidence of biliary ductal dilatation. Pancreas: No No evidence of pancreatic laceration. No mass or inflammatory changes. Spleen: Within normal limits in size and appearance. Adrenals/Urinary Tract: No  evidence of renal parenchymal injury or hematoma. No suspicious masses identified. No evidence of ureteral calculi or hydronephrosis. Gas noted in urinary bladder, most likely due to recent instrumentation. Stomach/Bowel: No evidence of bowel wall thickening, free intraperitoneal air, or hemoperitoneum. No mesenteric hematoma seen on today's exam. No evidence of obstruction, inflammatory process or abnormal fluid collections. Vascular/Lymphatic: No pathologically enlarged lymph nodes. No acute vascular findings. Reproductive:  No mass or other significant abnormality. Other:  None. Musculoskeletal: Mild intramuscular hematoma involving the left psoas muscle shows no significant change. Fractures of left transverse processes of L1 through L4 again seen. IMPRESSION: Stable small subcapsular hematoma along the anterior left hepatic lobe. Stable mild intramuscular hematoma involving the left psoas muscle. No evidence of mesenteric hematoma. Stable fractures of the left L1 through L4 transverse processes. Left chest tube in place with tiny residual pneumothorax in left lung base. Electronically Signed   By: Norleen DELENA Kil M.D.   On: 05/14/2023 15:20   DG CHEST PORT 1 VIEW Result Date: 05/14/2023 CLINICAL DATA:  33 year old female status post trauma with left pneumothorax, left rib fractures. Status post CT-guided chest tube placement. EXAM: PORTABLE CHEST 1 VIEW COMPARISON:  05/13/2023 CTs and radiographs. FINDINGS: Portable AP semi upright view at 0727 hours. Pigtail left chest tube projects above the diaphragm. Left rib fractures redemonstrated. Small, subtle left apical pneumothorax with pleural edge visible. Lower lung volumes today. Mediastinal contours remain within normal limits. No confluent lung opacity. Visible bowel-gas pattern within normal limits. Stable visualized osseous structures. IMPRESSION: 1. Left rib fractures and left chest tube in place with small residual left apical pneumothorax. 2. Lower lung  volumes.  No new cardiopulmonary abnormality. Electronically Signed   By: VEAR Hurst M.D.   On: 05/14/2023 09:34   CT [facility] PLEURAL DRAIN W/INDWELL CATH W/IMG GUIDE Result Date: 05/13/2023 INDICATION: 33 year old with a post traumatic left pneumothorax. EXAM: CT-GUIDED PLACEMENT OF LEFT CHEST TUBE TECHNIQUE: Multidetector CT imaging of the chest was performed following the standard protocol with/without IV contrast. RADIATION DOSE REDUCTION: This exam was performed according to the departmental dose-optimization program which includes automated exposure control, adjustment of the mA and/or kV according to patient size and/or use of iterative reconstruction technique. MEDICATIONS: 100 mcg fentanyl  ANESTHESIA/SEDATION: The patient's level of consciousness and vital signs were monitored continuously by radiology nursing throughout the procedure under my direct supervision. COMPLICATIONS: None immediate. PROCEDURE: Informed written consent was obtained from the patient after a thorough discussion of the procedural risks, benefits and alternatives. All questions were addressed. Maximal Sterile Barrier Technique was utilized including caps, mask, sterile gowns, sterile gloves, sterile drape, hand hygiene and skin antiseptic. A timeout was performed prior to the initiation of the procedure. Patient was placed on her right side. CT images through the chest were obtained. Large left pneumothorax was identified. The left mid axillary region was prepped and draped in sterile fashion. Skin was anesthetized with 1% lidocaine . Small incision was made. Using CT guidance, a Yueh catheter was directed into the pleural space and air was aspirated. Superstiff Amplatz wire was placed. The tract was dilated to accommodate a 14 French multipurpose drain. Drain was attached to a chest tube evacuation system. Tube was placed to wall suction and follow up CT images were obtained. Chest  tube was sutured in place. Dressing was placed.  FINDINGS: Large left pneumothorax. Following placement of the 32 French drain, the pneumothorax was nearly resolved. IMPRESSION: CT-guided placement of a left chest tube. Electronically Signed   By: Juliene Balder M.D.   On: 05/13/2023 17:59   DG Shoulder Left Port Result Date: 05/13/2023 CLINICAL DATA:  Close LEFT scapular fracture. EXAM: LEFT SHOULDER COMPARISON:  CT 05/13/2023 FINDINGS: Subtle nondisplaced LEFT scapular fracture identified on CT is barely evident is a linear lucency through the body of the scapula. Glenohumeral joint is intact. No evidence of or humeral fracture. The acromioclavicular joint is intact. Displaced posterior lateral third rib fracture noted IMPRESSION: 1. Nondisplaced scapular fracture. 2. No shoulder dislocation. Electronically Signed   By: Jackquline Boxer M.D.   On: 05/13/2023 13:40   DG CHEST PORT 1 VIEW Result Date: 05/13/2023 CLINICAL DATA:  Pneumothorax EXAM: PORTABLE CHEST 1 VIEW COMPARISON:  Same day CT FINDINGS: Small-moderate-sized left-sided pneumothorax. Very slight rightward deviation of the heart and mediastinum concerning for developing tension morphology. Heart size is normal. Both lungs are clear. No right-sided pneumothorax. Left-sided rib fractures including displaced posterior left third rib fracture. No subcutaneous emphysema. IMPRESSION: Small-moderate-sized left-sided pneumothorax with findings concerning for developing tension morphology. These results will be called to the ordering clinician or representative by the Radiologist Assistant, and communication documented in the PACS or [employer]. Electronically Signed   By: Mabel Converse D.O.   On: 05/13/2023 10:54   CT Cervical Spine Wo Contrast Result Date: 05/13/2023 CLINICAL DATA:  Presents with neck pain following a sledding accident. EXAM: CT CERVICAL SPINE WITHOUT CONTRAST TECHNIQUE: Multidetector CT imaging of the cervical spine was performed without intravenous contrast. Multiplanar CT  image reconstructions were also generated. RADIATION DOSE REDUCTION: This exam was performed according to the departmental dose-optimization program which includes automated exposure control, adjustment of the mA and/or kV according to patient size and/or use of iterative reconstruction technique. COMPARISON:  Contemporaneous chest, abdomen and pelvis CT. FINDINGS: Technical note: The patient was scanned right lateral decubitus due to condition. Alignment: Normal. Skull base and vertebrae: No acute fracture is evident. No primary bone lesion or focal pathologic process in the cervical spine. Soft tissues and spinal canal: No prevertebral fluid or swelling. No visible canal hematoma. Disc levels: There is preservation of the normal cervical disc heights. No herniated discs, cord compromise or other significant soft tissue or bony encroachment on the thecal sac is seen. Arthritic changes are not seen. The foramina are clear. Early endplate spurring is beginning to develop anteriorly at C4-5 and C5-6. Upper chest: Left apical pneumothorax is againNoted as well as a nondisplaced posterior left first rib fracture. Other: Bilaterally impacted mandibular wisdom teeth. IMPRESSION: 1. No evidence of cervical fractures or malalignment. 2. Early endplate spurring anteriorly at C4-5 and C5-6. 3. Left apical pneumothorax and nondisplaced posterior left first rib fracture. See chest CT report. Electronically Signed   By: Francis Quam M.D.   On: 05/13/2023 03:41   CT CHEST ABDOMEN PELVIS W CONTRAST Result Date: 05/13/2023 CLINICAL DATA:  Polytrauma, blunt EXAM: CT CHEST, ABDOMEN, AND PELVIS WITH CONTRAST TECHNIQUE: Multidetector CT imaging of the chest, abdomen and pelvis was performed following the standard protocol during bolus administration of intravenous contrast. RADIATION DOSE REDUCTION: This exam was performed according to the departmental dose-optimization program which includes automated exposure control, adjustment  of the mA and/or kV according to patient size and/or use of iterative reconstruction technique. CONTRAST:  OMNIPAQUE  IOHEXOL  300  MG/ML  SOLN COMPARISON:  None Available. FINDINGS: CHEST: Cardiovascular: No aortic injury. The thoracic aorta is normal in caliber. The heart is normal in size. No significant pericardial effusion. Mediastinum/Nodes: No pneumomediastinum. No mediastinal hematoma. The esophagus is unremarkable. The thyroid is unremarkable. The central airways are patent. No mediastinal, hilar, or axillary lymphadenopathy. Lungs/Pleura: No focal consolidation. No pulmonary nodule. No pulmonary mass. No pulmonary contusion or laceration. No pneumatocele formation. No pleural effusion. Small to moderate volume left pneumothorax. No right pneumothorax. No hemothorax. Musculoskeletal/Chest wall: No chest wall mass. Acute minimally displaced posterior left 1st rib fracture. Acute displaced left anterior 3rd rib fractures. Acute nondisplaced left anterior 4-6 rib fractures. No acute displaced sternal fracture. Likely acute nondisplaced left scapular fracture (2:19). No spinal fracture. ABDOMEN / PELVIS: Hepatobiliary: Not enlarged. A 2.2 x 1 x 4 cm subcapsular hypodensity along the left medial hepatic lobe (2:52). The gallbladder is otherwise unremarkable with no radio-opaque gallstones. No biliary ductal dilatation. Pancreas: Normal pancreatic contour. No main pancreatic duct dilatation. Spleen: Not enlarged. No focal lesion. No laceration, subcapsular hematoma, or vascular injury. Adrenals/Urinary Tract: No nodularity bilaterally. Bilateral kidneys enhance symmetrically. No hydronephrosis. No contusion, laceration, or subcapsular hematoma. No injury to the vascular structures or collecting systems. No hydroureter. The urinary bladder is unremarkable. On delayed imaging, there is no urothelial wall thickening and there are no filling defects in the opacified portions of the bilateral collecting systems or  ureters. Stomach/Bowel: No small or large bowel wall thickening or dilatation. The appendix is unremarkable. Vasculature/Lymphatics: No abdominal aorta or iliac aneurysm. No active contrast extravasation or pseudoaneurysm. No abdominal, pelvic, inguinal lymphadenopathy. Reproductive: Uterus and bilateral adnexal regions are unremarkable. Other: No simple free fluid ascites. No pneumoperitoneum. No hemoperitoneum. Question vague haziness of the small bowel mesentery with several loops of associated fluid-filled small bowel (not dilated) (2:85-90). No organized fluid collection. Musculoskeletal: Small fat containing umbilical hernia. Mild subcutaneus soft tissue hematoma along the lower back. Irregularity and asymmetric enlargement of the left psoas muscle with associated hypodense heterogeneity (2:87). No acute pelvic fracture. Right posterior acetabular lytic lesion likely of benign etiology. Please see separately dictated CT lumbar spine 05/13/2023. Ports and Devices: None. IMPRESSION: 1. Small to moderate volume pneumothorax. 2. A 2.2 x 1 x 4 cm left hepatic lobe subcapsular hematoma. 3. Left psoas intramuscular hematoma with retroperitoneal hemorrhage. 4. Developing small bowel mesenteric hematoma not fully excluded. Recommend serial abdominal exam and follow-up. 5. Acute minimally displaced posterior left 1st rib fracture. Acute displaced left anterior 3rd rib fractures. Acute nondisplaced left anterior 4-6 rib fractures. 6. Likely acute nondisplaced left scapular fracture 7. No acute fracture or traumatic malalignment of the thoracic spine. 8. Please see separately dictated CT lumbar spine 05/13/2023. 9. Other imaging findings of potential clinical significance: Right posterior acetabular lytic lesion likely of benign etiology. Correlation with prior radiography or cross-sectional imaging would be of value. These results were called by telephone at the time of interpretation on 05/13/2023 at 1:36 am to provider  VICENTA ABLE , who verbally acknowledged these results. Electronically Signed   By: Morgane  Naveau M.D.   On: 05/13/2023 01:44   CT L-SPINE NO CHARGE Result Date: 05/13/2023 CLINICAL DATA:  Lumbar radiculopathy, trauma EXAM: CT LUMBAR SPINE WITHOUT CONTRAST TECHNIQUE: Multidetector CT imaging of the lumbar spine was performed without intravenous contrast administration. Multiplanar CT image reconstructions were also generated. RADIATION DOSE REDUCTION: This exam was performed according to the departmental dose-optimization program which includes automated exposure control, adjustment of the mA  and/or kV according to patient size and/or use of iterative reconstruction technique. COMPARISON:  CT chest abdomen pelvis 05/13/2023 FINDINGS: Segmentation: 5 lumbar type vertebrae. Alignment: Normal. Vertebrae: Acute displaced left L1, L2, L3, L4 transverse process fractures. No focal pathologic process. Paraspinal and other soft tissues: Left psoas intramuscular hematoma with retroperitoneal hemorrhage. Disc levels: Maintained. IMPRESSION: Acute displaced left L1, L2, L3, L4 transverse process fractures. Associated left psoas intramuscular hematoma with retroperitoneal hemorrhage. Electronically Signed   By: Morgane  Naveau M.D.   On: 05/13/2023 01:42    Labs:  CBC: Recent Labs    05/12/23 2313 05/14/23 0408 05/15/23 0521  WBC 13.6* 8.9 6.8  HGB 12.4 11.9* 11.9*  HCT 35.4* 35.3* 34.8*  PLT 236 212 229    COAGS: No results for input(s): INR, APTT in the last 8760 hours.  BMP: Recent Labs    05/12/23 2313 05/14/23 0408 05/14/23 1025  NA 135 135 133*  K 3.5 3.7 3.5  CL 105 105 104  CO2 20* 21* 21*  GLUCOSE 118* 119* 107*  BUN 15 <5* 5*  CALCIUM 8.9 8.6* 8.6*  CREATININE 0.79 0.70 0.65  GFRNONAA >60 >60 >60    LIVER FUNCTION TESTS: No results for input(s): BILITOT, AST, ALT, ALKPHOS, PROT, ALBUMIN in the last 8760 hours.  Assessment and Plan:  Left chest tube  intact No air leak CXR today No PTX Plan for chest tube per [facility] (Discussed with Dr Hughes) Call IR if need  Electronically Signed: Sharlet DELENA Candle, PA-C 05/15/2023, 1:50 PM   I spent a total of 15 Minutes at the the patient's bedside AND on the patient's hospital floor or unit, greater than 50% of which was counseling/coordinating care for Left chest tube--- pneumothorax

## 2023-05-15 NOTE — TOC Initial Note (Signed)
 Transition of Care [facility]) - Initial/Assessment Note    Patient Details  Name: Jo [name] MRN: [id number] Date of Birth: 1990-06-07  Transition of Care [facility]) CM/SW Contact:    Adeyemi Hamad M, RN Phone Number: 05/15/2023, 4:34 PM  Clinical Narrative:                 33 yo female s/p sledding accident striking tree, sustained L rib fx 1, 3, 4-6; L PTX, L scapular fx in sling, grade 1 liver laceration, questionable small bowel mesenteric hematoma, and multiple lumbar TVP fx. S/P L chest tube placement 1/11.  PTA, pt independent and living at home with minor children, ages 52 and 2.  Her mother plans to stay with her upon discharge.   PT/OT recommending HH follow up; may have to consider OP follow up pending agency acceptance of commercial insurance.  Will continue to follow as patient progresses.     Expected Discharge Plan: OP Rehab Barriers to Discharge: Continued Medical Work up              Expected Discharge Plan and Services   Discharge Planning Services: CM Consult   Living arrangements for the past 2 months: Single Family Home                                      Prior Living Arrangements/Services Living arrangements for the past 2 months: Single Family Home Lives with:: Minor Children Patient language and need for interpreter reviewed:: Yes Do you feel safe going back to the place where you live?: Yes      Need for Family Participation in Patient Care: Yes (Comment) Care giver support system in place?: Yes (comment)   Criminal Activity/Legal Involvement Pertinent to Current Situation/Hospitalization: No - Comment as needed                 Emotional Assessment Appearance:: Appears stated age   Affect (typically observed): Appropriate Orientation: : Oriented to Self, Oriented to Place, Oriented to  Time, Oriented to Situation      Admission diagnosis:  Retroperitoneal hematoma [K68.3] Pneumothorax on left [J93.9] Sledding accident  [Y93.23] Traumatic pneumothorax, initial encounter [S27.0XXA] Closed fracture of transverse process of lumbar vertebra, initial encounter (HCC) [S32.009A] Closed fracture of multiple ribs of left side, initial encounter [S22.42XA] Liver injury, initial encounter [S36.119A] Patient Active Problem List   Diagnosis Date Noted   Pneumothorax on left 05/13/2023   Liver injury, initial encounter 05/13/2023   S/P cesarean section 08/31/2020   Maternal care due to low transverse uterine scar from previous cesarean delivery 08/31/2020   [redacted] weeks gestation of pregnancy 03/26/2019   PCP:  Patient, No Pcp Per Pharmacy:   [id number] - [place], [place] - [address] [place] [address] Phone: [phone] Fax: [phone]  [facility] [place], [place] - [address] [place] [address] Phone: [phone] Fax: [phone]  [id number] [place], [place] - [address] AT [facility] [address] [place] [address] Phone: [phone] Fax: [phone]  [facility] [address], [place] - [address] [place] [address] Phone: [phone] Fax: [phone]     Social Drivers of Health (SDOH) Social History: SDOH Screenings   Food Insecurity: No Food Insecurity (05/13/2023)  Housing: Low Risk  (  05/13/2023)  Transportation Needs: No Transportation Needs (05/13/2023)  Utilities: Not At Risk (05/13/2023)  Tobacco Use: Low Risk  (05/12/2023)   SDOH Interventions:     Readmission Risk Interventions     No data to display         Mliss MICAEL Fass, RN, BSN  Trauma/Neuro ICU Case Manager [phone]

## 2023-05-15 NOTE — Evaluation (Signed)
 Occupational Therapy Evaluation Patient Details Name: Jo [name] MRN: [id number] DOB: 05-Feb-1991 Today's Date: 05/15/2023   History of Present Illness 33 yo female s/p sledding accident striking tree, sustained L rib fx 1, 3, 4-6; L PTX, L scapular fx in sling, grade 1 liver laceration, questionable small bowel mesenteric hematoma, and multiple lumbar TVP fx. S/P L chest tube placement 1/11. PMH unremarkable.   Clinical Impression   Pt very limited by pain for todays session despite max pre-medication coordination with RN. 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 Today she is mod A +2 for bed mobility, struggled moving LLE, LUE in sling (educated on donning and positioning), mod A to CGA for seated balance EOB, Pt is dependent on SUE support and was max A for grooming while seated EOB. Pt max A for LB dressing. Pt unable to come to full upright sit<>stand despite +2 assist, crying out in pain. Pt will benefit from skilled OT in the acute setting and afterwards at [facility] level to maximize safety and independence in ADL and functional transfers. Pt does have excellent family support and will benefit from increased frequency while hospitalized. Next session take shoulder handout for basic ADL information and LUE management. Waiting on clarification for ROM of LUE.        If plan is discharge home, recommend the following: Two people to help with walking and/or transfers;A lot of help with bathing/dressing/bathroom;Assistance with cooking/housework;Assist for transportation;Help with stairs or ramp for entrance    Functional Status Assessment  Patient has had a recent decline in their functional status and demonstrates the ability to make significant improvements in function in a reasonable and predictable amount of time.  Equipment Recommendations  BSC/3in1     Recommendations for Other Services PT consult     Precautions / Restrictions Precautions Precautions: Fall Precaution Comments: L rib fx 1, 3, 4-6; L PTX, L scapular fx (non-op); L1-4 TVP fx, L chest tube Restrictions Weight Bearing Restrictions Per Provider Order: Yes LUE Weight Bearing Per Provider Order: Non weight bearing Other Position/Activity Restrictions: waiting on clarification for ROM of LUE      Mobility Bed Mobility Overal bed mobility: Needs Assistance Bed Mobility: Supine to Sit, Sit to Supine     Supine to sit: Mod assist, +2 for safety/equipment, Used rails, HOB elevated Sit to supine: Mod assist, HOB elevated, Used rails   General bed mobility comments: cues for sequencing throughout, assist with RLE, trunk elevation/descent, and use of bed pad to facilitate hips EOB    Transfers Overall transfer level: Needs assistance Equipment used: 2 person hand held assist Transfers: Sit to/from Stand Sit to Stand: Max assist, +2 physical assistance, +2 safety/equipment           General transfer comment: unable to come to full upright despite max cues and assist. Pt yelling out in pain in lower back and immediately crying HR jumped into high 140's (up to 150)      Balance Overall balance assessment: Needs assistance Sitting-balance support: Single extremity supported Sitting balance-Leahy Scale: Poor Sitting balance - Comments: limited by pain Postural control: Right lateral lean Standing balance support: Single extremity supported Standing balance-Leahy Scale: Zero Standing balance comment: unable to come to full upright  ADL either performed or assessed with clinical judgement   ADL Overall ADL's : Needs assistance/impaired Eating/Feeding: Minimal assistance;Bed level Eating/Feeding Details (indicate cue type and reason): Pt could do more, but Pt getting lots of assist from family present Grooming: Maximal  assistance;Sitting Grooming Details (indicate cue type and reason): Pt requests assist for washing her face Upper Body Bathing: Moderate assistance   Lower Body Bathing: Moderate assistance   Upper Body Dressing : Maximal assistance Upper Body Dressing Details (indicate cue type and reason): educated on donning and positioning of sling Lower Body Dressing: Maximal assistance;Bed level Lower Body Dressing Details (indicate cue type and reason): socks Toilet Transfer: Maximal assistance;+2 for physical assistance;+2 for safety/equipment                   Vision Baseline Vision/History: 0 No visual deficits Ability to See in Adequate Light: 0 Adequate Patient Visual Report: No change from baseline Vision Assessment?: No apparent visual deficits     Perception         Praxis         Pertinent Vitals/Pain Pain Assessment Pain Assessment: 0-10 Pain Score: 10-Worst pain ever Pain Location: lower back, L scapula Pain Descriptors / Indicators: Discomfort, Grimacing, Crying, Sharp, Stabbing Pain Intervention(s): Limited activity within patient's tolerance, Monitored during session, Repositioned, RN gave pain meds during session     Extremity/Trunk Assessment Upper Extremity Assessment Upper Extremity Assessment: LUE deficits/detail;Right hand dominant LUE Deficits / Details: in sling, L scapula fx, NWB, waiting on clarification of ROM of LUE LUE: Unable to fully assess due to immobilization LUE Sensation: WNL LUE Coordination: decreased gross motor   Lower Extremity Assessment Lower Extremity Assessment: Defer to PT evaluation   Cervical / Trunk Assessment Cervical / Trunk Assessment: Other exceptions Cervical / Trunk Exceptions: L rib fx 1, 3, 4-6; L1-4 TVP fx, L chest tube   Communication Communication Communication: No apparent difficulties Cueing Techniques: Verbal cues;Tactile cues   Cognition Arousal: Alert Behavior During Therapy: Anxious Overall Cognitive  Status: Within Functional Limits for tasks assessed                                       General Comments  mother, nephew, cousin all present throughout session    Exercises     Shoulder Instructions      Home Living Family/patient expects to be discharged to:: Private residence Living Arrangements: Children (4 and 2) Available Help at Discharge: Family;Available 24 hours/day Type of Home: House Home Access: Stairs to enter [employer] of Steps: 5   Home Layout: One level     Bathroom Shower/Tub: Tub/shower unit;Walk-in shower   Bathroom Toilet: Standard     Home Equipment: None   Additional Comments: Mother plans to stay with her after discharge      Prior Functioning/Environment Prior Level of Function : Independent/Modified Independent             Mobility Comments: no DME ADLs Comments: completely independent, works in education officer, environmental for a systems analyst, drives        OT Problem List: Decreased range of motion;Decreased activity tolerance;Impaired balance (sitting and/or standing);Decreased knowledge of use of DME or AE;Decreased knowledge of precautions;Impaired UE functional use;Pain      OT Treatment/Interventions: Self-care/ADL training;DME and/or AE instruction;Manual therapy;Therapeutic activities;Patient/family education;Balance training    OT Goals(Current goals can be found in the care plan section) Acute  Rehab OT Goals Patient Stated Goal: I need to walk before I can go home OT Goal Formulation: With patient/family Time For Goal Achievement: 05/29/23 Potential to Achieve Goals: Good ADL Goals Pt Will Perform Grooming: with set-up;sitting Pt Will Perform Upper Body Dressing: with min assist;with caregiver independent in assisting;sitting Pt Will Perform Lower Body Dressing: with min assist;with caregiver independent in assisting;sit to/from stand Pt Will Transfer to Toilet: with contact guard  assist;ambulating Pt Will Perform Toileting - Clothing Manipulation and hygiene: with contact guard assist;sitting/lateral leans Additional ADL Goal #1: Pt will perform bed mobility at min guard level prior to engaging in ADL  OT Frequency: Min 1X/week    Co-evaluation PT/OT/SLP Co-Evaluation/Treatment: Yes Reason for Co-Treatment: For patient/therapist safety;To address functional/ADL transfers;Other (comment) (pain management) PT goals addressed during session: Mobility/safety with mobility;Balance;Strengthening/ROM OT goals addressed during session: ADL's and self-care;Strengthening/ROM      AM-PAC OT 6 Clicks Daily Activity     Outcome Measure Help from another person eating meals?: A Little Help from another person taking care of personal grooming?: A Little Help from another person toileting, which includes using toliet, bedpan, or urinal?: A Lot Help from another person bathing (including washing, rinsing, drying)?: A Lot Help from another person to put on and taking off regular upper body clothing?: A Lot Help from another person to put on and taking off regular lower body clothing?: A Lot 6 Click Score: 14   End of Session Equipment Utilized During Treatment: Gait belt Nurse Communication: Mobility status;Precautions;Weight bearing status  Activity Tolerance: Patient limited by pain Patient left: in bed;with call bell/phone within reach;with bed alarm set;with family/visitor present  OT Visit Diagnosis: Unsteadiness on feet (R26.81);Other abnormalities of gait and mobility (R26.89);Muscle weakness (generalized) (M62.81)                Time: 8867-8794 OT Time Calculation (min): 33 min Charges:  OT General Charges $OT Visit: 1 Visit OT Evaluation $OT Eval Moderate Complexity: 1 Mod  Jo [name] Acute Rehabilitation Services Office: [phone]  Jo [name] 05/15/2023, 1:25 PM

## 2023-05-16 ENCOUNTER — Inpatient Hospital Stay (HOSPITAL_COMMUNITY): Payer: 59

## 2023-05-16 LAB — CBC
HCT: 34.8 % — ABNORMAL LOW (ref 36.0–46.0)
Hemoglobin: 11.5 g/dL — ABNORMAL LOW (ref 12.0–15.0)
MCH: 31.8 pg (ref 26.0–34.0)
MCHC: 33 g/dL (ref 30.0–36.0)
MCV: 96.1 fL (ref 80.0–100.0)
Platelets: 258 10*3/uL (ref 150–400)
RBC: 3.62 MIL/uL — ABNORMAL LOW (ref 3.87–5.11)
RDW: 11.2 % — ABNORMAL LOW (ref 11.5–15.5)
WBC: 4.9 10*3/uL (ref 4.0–10.5)
nRBC: 0 % (ref 0.0–0.2)

## 2023-05-16 LAB — BASIC METABOLIC PANEL
Anion gap: 10 (ref 5–15)
BUN: 9 mg/dL (ref 6–20)
CO2: 26 mmol/L (ref 22–32)
Calcium: 8.9 mg/dL (ref 8.9–10.3)
Chloride: 100 mmol/L (ref 98–111)
Creatinine, Ser: 0.7 mg/dL (ref 0.44–1.00)
GFR, Estimated: >60 mL/min (ref >60)
Glucose, Bld: 109 mg/dL — ABNORMAL HIGH (ref 70–99)
Potassium: 3.2 mmol/L — ABNORMAL LOW (ref 3.5–5.1)
Sodium: 136 mmol/L (ref 135–145)

## 2023-05-16 MED ORDER — HYDROMORPHONE HCL 1 MG/ML IJ SOLN
1.0000 mg | INTRAMUSCULAR | Status: DC | PRN
  Administered 2023-05-16 – 2023-05-18 (×3): 1 mg via INTRAVENOUS
  Filled 2023-05-16 (×4): qty 1

## 2023-05-16 MED ORDER — BISACODYL 5 MG PO TBEC
10.0000 mg | DELAYED_RELEASE_TABLET | Freq: Once | ORAL | Status: AC
  Administered 2023-05-16: 10 mg via ORAL
  Filled 2023-05-16: qty 2

## 2023-05-16 MED ORDER — SENNA 8.6 MG PO TABS
2.0000 | ORAL_TABLET | Freq: Once | ORAL | Status: AC
Start: 2023-05-16 — End: 2023-05-16
  Administered 2023-05-16: 17.2 mg via ORAL
  Filled 2023-05-16: qty 2

## 2023-05-16 MED ORDER — TRAMADOL HCL 50 MG PO TABS
100.0000 mg | ORAL_TABLET | Freq: Four times a day (QID) | ORAL | Status: DC
  Administered 2023-05-16 – 2023-05-18 (×9): 100 mg via ORAL
  Filled 2023-05-16 (×9): qty 2

## 2023-05-16 MED ORDER — GABAPENTIN 300 MG PO CAPS
600.0000 mg | ORAL_CAPSULE | Freq: Three times a day (TID) | ORAL | Status: DC
Start: 2023-05-16 — End: 2023-05-18
  Administered 2023-05-16 – 2023-05-18 (×6): 600 mg via ORAL
  Filled 2023-05-16: qty 6
  Filled 2023-05-16 (×3): qty 2
  Filled 2023-05-16: qty 6
  Filled 2023-05-16: qty 2

## 2023-05-16 MED ORDER — POTASSIUM CHLORIDE 20 MEQ PO PACK
40.0000 meq | PACK | ORAL | Status: AC
  Administered 2023-05-16 (×2): 40 meq via ORAL
  Filled 2023-05-16 (×2): qty 2

## 2023-05-16 MED ORDER — POLYETHYLENE GLYCOL 3350 17 G PO PACK
17.0000 g | PACK | Freq: Two times a day (BID) | ORAL | Status: DC
Start: 2023-05-16 — End: 2023-05-17
  Administered 2023-05-16: 17 g via ORAL
  Filled 2023-05-16: qty 1

## 2023-05-16 MED ORDER — MAGNESIUM HYDROXIDE 400 MG/5ML PO SUSP
30.0000 mL | Freq: Once | ORAL | Status: AC
  Administered 2023-05-16: 30 mL via ORAL
  Filled 2023-05-16: qty 30

## 2023-05-16 MED ORDER — QUETIAPINE FUMARATE 50 MG PO TABS
50.0000 mg | ORAL_TABLET | Freq: Every evening | ORAL | Status: DC | PRN
  Filled 2023-05-16: qty 1

## 2023-05-16 NOTE — Progress Notes (Signed)
 Physical Therapy Treatment Patient Details Name: Jo FRANCKOWIAK MRN: [id number] DOB: 20-Jan-1991 Today's Date: 05/16/2023   History of Present Illness 33 yo female s/p sledding accident striking tree, sustained L rib fx 1, 3, 4-6; L PTX, L scapular fx in sling, grade 1 liver laceration, questionable small bowel mesenteric hematoma, and multiple lumbar TVP fx. S/P L chest tube placement 1/11. PMH unremarkable.    PT Comments  Patient progressing with mobility able to tolerate up to recliner though no ambulation yet.  She was more independent with supine to sit from elevated [facility] and sister reports her spouse bought her a recliner for home.  She continues with L side pain limiting mobility.  PT will follow up later as schedule allows and pt able to attempt ambulation.    If plan is discharge home, recommend the following: A lot of help with bathing/dressing/bathroom;A lot of help with walking and/or transfers;Help with stairs or ramp for entrance;Assistance with cooking/housework;Assist for transportation   Can travel by private vehicle        Equipment Recommendations  Rolling walker (2 wheels);BSC/3in1    Recommendations for Other Services       Precautions / Restrictions Precautions Precautions: Fall Precaution Comments: L chest tube, rib and lumber TVP fx's on L Required Braces or Orthoses: Sling Restrictions LUE Weight Bearing Per Provider Order: Touch down weight bearing     Mobility  Bed Mobility Overal bed mobility: Needs Assistance Bed Mobility: Supine to Sit     Supine to sit: HOB elevated, Used rails, Min assist     General bed mobility comments: help to lift trunk, pt able to get legs off EOB and roll to R with rail unaided    Transfers Overall transfer level: Needs assistance Equipment used: 1 person hand held assist Transfers: Sit to/from Stand, Bed to chair/wheelchair/BSC Sit to Stand: Mod assist, +2 safety/equipment   Step pivot transfers: Mod assist, +2  safety/equipment       General transfer comment: assist for lifting, stepping to recliner with R HHA and support for balance due to pain    Ambulation/Gait                   Stairs             Wheelchair Mobility     Tilt Bed    Modified Rankin (Stroke Patients Only)       Balance Overall balance assessment: Needs assistance   Sitting balance-Leahy Scale: Good Sitting balance - Comments: still using R UE due to L side pain, but balance ok   Standing balance support: Single extremity supported Standing balance-Leahy Scale: Poor Standing balance comment: UE support due to pain and R LE weakness                            Cognition Arousal: Alert Behavior During Therapy: WFL for tasks assessed/performed Overall Cognitive Status: Within Functional Limits for tasks assessed                                          Exercises Other Exercises Other Exercises: incentive spirometer x 4 up to 1200-1537ml    General Comments General comments (skin integrity, edema, etc.): mother in the room, RN delivered meds for bowels and pain initially before OOB      Pertinent Vitals/Pain Pain Assessment Pain  Assessment: Faces Faces Pain Scale: Hurts even more Pain Location: lower back, L scapula Pain Descriptors / Indicators: Grimacing, Guarding, Discomfort Pain Intervention(s): Monitored during session, Premedicated before session    Home Living                          Prior Function            PT Goals (current goals can now be found in the care plan section) Progress towards PT goals: Progressing toward goals    Frequency    Min 1X/week      PT Plan      Co-evaluation              AM-PAC PT 6 Clicks Mobility   Outcome Measure  Help needed turning from your back to your side while in a flat bed without using bedrails?: A Lot Help needed moving from lying on your back to sitting on the side of a  flat bed without using bedrails?: A Lot Help needed moving to and from a bed to a chair (including a wheelchair)?: A Lot Help needed standing up from a chair using your arms (e.g., wheelchair or bedside chair)?: A Lot Help needed to walk in hospital room?: Total Help needed climbing 3-5 steps with a railing? : Total 6 Click Score: 10    End of Session Equipment Utilized During Treatment: Other (comment) (sling) Activity Tolerance: Patient limited by pain Patient left: in chair;with call bell/phone within reach;with family/visitor present   PT Visit Diagnosis: Difficulty in walking, not elsewhere classified (R26.2);Pain Pain - Right/Left: Left Pain - part of body:  (trunk and back)     Time: 8899-8878 PT Time Calculation (min) (ACUTE ONLY): 21 min  Charges:    $Therapeutic Activity: 8-22 mins PT General Charges $$ ACUTE PT VISIT: 1 Visit                     Micheline Portal, PT Acute Rehabilitation Services Office:[phone] 05/16/2023    Montie Portal 05/16/2023, 1:58 PM

## 2023-05-16 NOTE — Progress Notes (Signed)
 PT Cancellation Note  Patient Details Name: Jo [name] MRN: [id number] DOB: November 27, 1990   Cancelled Treatment:    Reason Eval/Treat Not Completed: Fatigue/lethargy limiting ability to participate; did attempt for second visit and noted pt just s/p CT removal and wanting to rest.  Encouraged up later possibly to bathroom with nursing and ok to use binder if needed.  Will follow up.    Montie Portal 05/16/2023, 4:22 PM Micheline Portal, PT Acute Rehabilitation Services Office:[phone] 05/16/2023

## 2023-05-16 NOTE — Progress Notes (Signed)
 Referring Provider(s): Lonni Pizza Trauma  Supervising Physician: Hughes Simmonds  Patient Status:  [facility] - In-pt  Chief Complaint:  Traumatic pneumothorax - remove chest tube  Brief History:  Jo [name] is a 33 y.o. female who presented to the ED on 05/12/23 with severe back and flank pain following a sledding injury.   She was unable to ambulate and was transported to the ED by her family.   Imaging showed numerous injuries including fractures to the ribs, left scapula and lumbar vertebrae.   Imaging also showed a left pneumothorax that on presentation was not significant enough for chest tube placement.   A chest radiograph the next morning showed the pneumothorax had slightly enlarged and a left chest tube placement was attempted by the Trauma/Surgery team.   She was unable to tolerate the procedure with only local anesthesia so she underwent placement by Dr. Philip.  Trauma surgery has followed the chest tube with us .  They feel it can be removed today.  It is currently to water  seal and pneumothorax has resolved.  Subjective:  Doing better. Still in some pain.   Allergies: Patient has no known allergies.  Medications: Prior to Admission medications   Medication Sig Start Date End Date Taking? Authorizing Provider  albuterol (VENTOLIN HFA) 108 (90 Base) MCG/ACT inhaler Inhale 1-2 puffs into the lungs every 4 (four) hours as needed for wheezing or shortness of breath.   Yes [provider]  HAILEY 24 FE 1-20 MG-MCG(24) tablet Take 1 tablet by mouth daily. 03/28/23  Yes [provider]  HYDROMET 5-1.5 MG/5ML syrup Take 5 mLs by mouth 2 (two) times daily as needed for cough. 04/10/23  Yes [provider]  Ibuprofen -Acetaminophen  (ADVIL  DUAL ACTION) 125-250 MG TABS Take 1-2 tablets by mouth 2 (two) times daily as needed (Pain).   Yes [provider]     Vital Signs: BP 100/72 (BP Location: Left Arm)   Pulse 82   Temp 98  F (36.7 C) (Oral)   Resp 13   Ht 5' 6 (1.676 m)   Wt 142 lb (64.4 kg)   LMP 05/10/2023 (Approximate)   SpO2 98%   BMI 22.92 kg/m   Physical Exam Vitals reviewed.  Constitutional:      Appearance: Normal appearance.  Cardiovascular:     Rate and Rhythm: Normal rate.  Pulmonary:     Effort: Pulmonary effort is normal. No respiratory distress.  Abdominal:     Palpations: Abdomen is soft.  Neurological:     General: No focal deficit present.     Mental Status: She is alert and oriented to person, place, and time.  Psychiatric:        Mood and Affect: Mood normal.        Behavior: Behavior normal.        Thought Content: Thought content normal.        Judgment: Judgment normal.      Labs:  CBC: Recent Labs    05/12/23 2313 05/14/23 0408 05/15/23 0521 05/16/23 1124  WBC 13.6* 8.9 6.8 4.9  HGB 12.4 11.9* 11.9* 11.5*  HCT 35.4* 35.3* 34.8* 34.8*  PLT 236 212 229 258    COAGS: No results for input(s): INR, APTT in the last 8760 hours.  BMP: Recent Labs    05/12/23 2313 05/14/23 0408 05/14/23 1025 05/16/23 1124  NA 135 135 133* 136  K 3.5 3.7 3.5 3.2*  CL 105 105 104 100  CO2 20* 21* 21* 26  GLUCOSE 118* 119* 107* 109*  BUN 15 <5* 5* 9  CALCIUM 8.9 8.6* 8.6* 8.9  CREATININE 0.79 0.70 0.65 0.70  GFRNONAA >60 >60 >60 >60    LIVER FUNCTION TESTS: No results for input(s): BILITOT, AST, ALT, ALKPHOS, PROT, ALBUMIN in the last 8760 hours.  Assessment and Plan:  Traumatic pneumothorax - s/p chest tube placement on 05/13/23.   Pneumothorax resolved - Gen Surgery asking for removal.  Chest tube removed without difficulty.  Patient can removed chest all dressing on 05/18/23.  Will obtain chest Xray to ensure stability after pigtail removal.  Electronically Signed: SARI GORMAN LAMP, PA-C 05/16/2023, 1:05 PM    I spent a total of 15 Minutes at the the patient's bedside AND on the patient's hospital floor or unit, greater than 50% of which  was counseling/coordinating care for chest tube removal.

## 2023-05-16 NOTE — Progress Notes (Addendum)
   Trauma/Critical Care Follow Up Note  Subjective:    Overnight Issues:   Objective:  Vital signs for last 24 hours: Temp:  [98 F (36.7 C)-98.6 F (37 C)] 98 F (36.7 C) (01/14 0800) Pulse Rate:  [82-85] 82 (01/14 0551) Resp:  [12-23] 12 (01/14 0800) BP: (99-126)/(71-89) 99/71 (01/14 0800) SpO2:  [97 %-98 %] 98 % (01/13 1930)  Hemodynamic parameters for last 24 hours:    Intake/Output from previous day: 01/13 0701 - 01/14 0700 In: 723 [P.O.:720; I.V.:3] Out: -   Intake/Output this shift: No intake/output data recorded.  Vent settings for last 24 hours:    Physical Exam:  Gen: comfortable, no distress Neuro: follows commands, alert, communicative HEENT: PERRL Neck: supple CV: RRR Pulm: unlabored breathing on RA Abd: soft, NT   ,    GU: urine clear and yellow, +spontaneous voids Extr: wwp, no edema  Results for orders placed or performed during the hospital encounter of 05/12/23 (from the past 24 hours)  hCG, quantitative, pregnancy     Status: Abnormal   Collection Time: 05/15/23  6:22 PM  Result Value Ref Range   hCG, Beta Chain, Quant, S 57 (H) <5 mIU/mL    Assessment & Plan: The plan of care was discussed with the bedside nurse for the day, who is in agreement with this plan and no additional concerns were raised.   Present on Admission:  Pneumothorax on left  Liver injury, initial encounter    LOS: 3 days   Additional comments:I reviewed the patient's new clinical lab test results.   and I reviewed the patients new imaging test results.    Sledding and struck a tree   Left rib fractures 1, 3, 4-6 - Multimodal pain control and pulmonary toilet Left pneumothorax - IR guided chest tube 1/11; no PTX on CXR, remove CT today Left scapular fracture -Dr. Fidel consulted;nonop mgmt planned - NWB LUE; sling Grade 1 liver laceration Questionable small bowel mesenteric hematoma - abdomen is not significantly tender.  Continue to allow diet and follow  exam. Multiple lumbar transverse process fractures -pain control and muscle relaxer +UPT and bhCG(quant) - downtrending, c/w recent pregnancy termination FEN - reg diet, escalate bowel regimen PPX: SCDs for now; LMWH Dispo - 4NP    Dreama GEANNIE Hanger, MD Trauma & General Surgery Please use [email or web address] to contact on call provider  05/16/2023  *Care during the described time interval was provided by me. I have reviewed this patient's available data, including medical history, events of note, physical examination and test results as part of my evaluation.

## 2023-05-16 NOTE — Plan of Care (Signed)
  Problem: Education: Goal: Knowledge of General Education information will improve Description: Including pain rating scale, medication(s)/side effects and non-pharmacologic comfort measures 05/16/2023 0401 by Marvis Kenneth SAILOR, RN Outcome: Progressing 05/15/2023 2044 by Marvis Kenneth SAILOR, RN Outcome: Not Progressing   Problem: Health Behavior/Discharge Planning: Goal: Ability to manage health-related needs will improve 05/16/2023 0401 by Marvis Kenneth SAILOR, RN Outcome: Progressing 05/15/2023 2044 by Marvis Kenneth SAILOR, RN Outcome: Not Progressing   Problem: Clinical Measurements: Goal: Ability to maintain clinical measurements within normal limits will improve 05/16/2023 0401 by Marvis Kenneth SAILOR, RN Outcome: Progressing 05/15/2023 2044 by Marvis Kenneth SAILOR, RN Outcome: Not Progressing Goal: Will remain free from infection 05/16/2023 0401 by Marvis Kenneth SAILOR, RN Outcome: Progressing 05/15/2023 2044 by Marvis Kenneth SAILOR, RN Outcome: Not Progressing Goal: Diagnostic test results will improve 05/16/2023 0401 by Marvis Kenneth SAILOR, RN Outcome: Progressing 05/15/2023 2044 by Marvis Kenneth SAILOR, RN Outcome: Not Progressing Goal: Respiratory complications will improve 05/16/2023 0401 by Marvis Kenneth SAILOR, RN Outcome: Progressing 05/15/2023 2044 by Marvis Kenneth SAILOR, RN Outcome: Not Progressing Goal: Cardiovascular complication will be avoided 05/16/2023 0401 by Marvis Kenneth SAILOR, RN Outcome: Progressing 05/15/2023 2044 by Marvis Kenneth SAILOR, RN Outcome: Not Progressing   Problem: Activity: Goal: Risk for activity intolerance will decrease 05/16/2023 0401 by Marvis Kenneth SAILOR, RN Outcome: Progressing 05/15/2023 2044 by Marvis Kenneth SAILOR, RN Outcome: Not Progressing   Problem: Nutrition: Goal: Adequate nutrition will be maintained 05/16/2023 0401 by Marvis Kenneth SAILOR, RN Outcome: Progressing 05/15/2023 2044 by Marvis Kenneth SAILOR, RN Outcome: Not Progressing   Problem:  Coping: Goal: Level of anxiety will decrease 05/16/2023 0401 by Marvis Kenneth SAILOR, RN Outcome: Progressing 05/15/2023 2044 by Marvis Kenneth SAILOR, RN Outcome: Not Progressing   Problem: Elimination: Goal: Will not experience complications related to bowel motility 05/16/2023 0401 by Marvis Kenneth SAILOR, RN Outcome: Progressing 05/15/2023 2044 by Marvis Kenneth SAILOR, RN Outcome: Not Progressing Goal: Will not experience complications related to urinary retention 05/16/2023 0401 by Marvis Kenneth SAILOR, RN Outcome: Progressing 05/15/2023 2044 by Marvis Kenneth SAILOR, RN Outcome: Not Progressing   Problem: Pain Management: Goal: General experience of comfort will improve 05/16/2023 0401 by Marvis Kenneth SAILOR, RN Outcome: Progressing 05/15/2023 2044 by Marvis Kenneth SAILOR, RN Outcome: Not Progressing   Problem: Safety: Goal: Ability to remain free from injury will improve 05/16/2023 0401 by Marvis Kenneth SAILOR, RN Outcome: Progressing 05/15/2023 2044 by Marvis Kenneth SAILOR, RN Outcome: Not Progressing   Problem: Skin Integrity: Goal: Risk for impaired skin integrity will decrease 05/16/2023 0401 by Marvis Kenneth SAILOR, RN Outcome: Progressing 05/15/2023 2044 by Marvis Kenneth SAILOR, RN Outcome: Not Progressing

## 2023-05-17 ENCOUNTER — Inpatient Hospital Stay (HOSPITAL_COMMUNITY): Payer: 59

## 2023-05-17 LAB — BASIC METABOLIC PANEL
Anion gap: 7 (ref 5–15)
BUN: 9 mg/dL (ref 6–20)
CO2: 26 mmol/L (ref 22–32)
Calcium: 8.6 mg/dL — ABNORMAL LOW (ref 8.9–10.3)
Chloride: 104 mmol/L (ref 98–111)
Creatinine, Ser: 0.66 mg/dL (ref 0.44–1.00)
GFR, Estimated: >60 mL/min (ref >60)
Glucose, Bld: 126 mg/dL — ABNORMAL HIGH (ref 70–99)
Potassium: 3.7 mmol/L (ref 3.5–5.1)
Sodium: 137 mmol/L (ref 135–145)

## 2023-05-17 MED ORDER — POLYETHYLENE GLYCOL 3350 17 G PO PACK
17.0000 g | PACK | Freq: Three times a day (TID) | ORAL | Status: DC
  Administered 2023-05-17: 17 g via ORAL
  Filled 2023-05-17: qty 1

## 2023-05-17 MED ORDER — MAGNESIUM CITRATE PO SOLN
0.5000 | Freq: Once | ORAL | Status: AC
  Administered 2023-05-17: 0.5 via ORAL
  Filled 2023-05-17: qty 296

## 2023-05-17 MED ORDER — BISACODYL 10 MG RE SUPP
10.0000 mg | Freq: Once | RECTAL | Status: AC
  Administered 2023-05-17: 10 mg via RECTAL
  Filled 2023-05-17: qty 1

## 2023-05-17 NOTE — Progress Notes (Signed)
 Trauma/Critical Care Follow Up Note  Subjective:    Overnight Issues:   Objective:  Vital signs for last 24 hours: Temp:  [97.7 F (36.5 C)-98.6 F (37 C)] 98.4 F (36.9 C) (01/15 0756) Pulse Rate:  [85-100] 87 (01/15 0312) Resp:  [12-14] 14 (01/15 0756) BP: (99-119)/(70-73) 101/73 (01/15 0756) SpO2:  [97 %-100 %] 99 % (01/15 0312)  Hemodynamic parameters for last 24 hours:    Intake/Output from previous day: 01/14 0701 - 01/15 0700 In: 1083 [P.O.:1080; I.V.:3] Out: 2 [Urine:1; Emesis/NG output:1]  Intake/Output this shift: No intake/output data recorded.  Vent settings for last 24 hours:    Physical Exam:  Gen: comfortable, no distress Neuro: follows commands, alert, communicative HEENT: PERRL Neck: supple CV: RRR Pulm: unlabored breathing on RA Abd: soft, NT   ,    GU: urine clear and yellow, +spontaneous voids Extr: wwp, no edema  Results for orders placed or performed during the hospital encounter of 05/12/23 (from the past 24 hours)  CBC     Status: Abnormal   Collection Time: 05/16/23 11:24 AM  Result Value Ref Range   WBC 4.9 4.0 - 10.5 K/uL   RBC 3.62 (L) 3.87 - 5.11 MIL/uL   Hemoglobin 11.5 (L) 12.0 - 15.0 g/dL   HCT 16.1 (L) 09.6 - 04.5 %   MCV 96.1 80.0 - 100.0 fL   MCH 31.8 26.0 - 34.0 pg   MCHC 33.0 30.0 - 36.0 g/dL   RDW 40.9 (L) 81.1 - 91.4 %   Platelets 258 150 - 400 K/uL   nRBC 0.0 0.0 - 0.2 %  Basic metabolic panel     Status: Abnormal   Collection Time: 05/16/23 11:24 AM  Result Value Ref Range   Sodium 136 135 - 145 mmol/L   Potassium 3.2 (L) 3.5 - 5.1 mmol/L   Chloride 100 98 - 111 mmol/L   CO2 26 22 - 32 mmol/L   Glucose, Bld 109 (H) 70 - 99 mg/dL   BUN 9 6 - 20 mg/dL   Creatinine, Ser 7.82 0.44 - 1.00 mg/dL   Calcium 8.9 8.9 - 95.6 mg/dL   GFR, Estimated >21 >30 mL/min   Anion gap 10 5 - 15    Assessment & Plan: The plan of care was discussed with the bedside nurse for the day, who is in agreement with this plan and no  additional concerns were raised.   Present on Admission:  Pneumothorax on left  Liver injury, initial encounter    LOS: 4 days   Additional comments:I reviewed the patient's new clinical lab test results.   and I reviewed the patients new imaging test results.    Sledding and struck a tree   Left rib fractures 1, 3, 4-6 - Multimodal pain control and pulmonary toilet Left pneumothorax - IR guided chest tube 1/11; no PTX on CXR, removed 1/14. CXR read P, no PTX on my wet read Left scapular fracture -Dr. Charol Copas consulted;nonop mgmt planned - NWB LUE; sling Grade 1 liver laceration Questionable small bowel mesenteric hematoma - abdomen is not significantly tender.  Emesis yest, timing seems related to stool softening meds. Continue to allow diet and follow exam. Multiple lumbar transverse process fractures -pain control and muscle relaxer +UPT and bhCG(quant) - downtrending, c/w recent pregnancy termination FEN - reg diet, escalate bowel regimen again PPX: SCDs; LMWH Dispo - 4NP   Anda Bamberg, MD Trauma & General Surgery Please use [email or web address] to contact on call provider  05/17/2023  *  Care during the described time interval was provided by me. I have reviewed this patient's available data, including medical history, events of note, physical examination and test results as part of my evaluation.

## 2023-05-17 NOTE — Progress Notes (Signed)
 Physical Therapy Treatment Patient Details Name: Jo [name] MRN: [id number] DOB: 1991-03-17 Today's Date: 05/17/2023   History of Present Illness 33 yo female s/p sledding accident striking tree, sustained L rib fx 1, 3, 4-6; L PTX, L scapular fx in sling, grade 1 liver laceration, questionable small bowel mesenteric hematoma, and multiple lumbar TVP fx. S/P L chest tube placement 1/11. PMH unremarkable.    PT Comments  Progressing to in room ambulation with 30% help pt holding therapists arm on R side.  She was able to sit up on EOB with HOB up using rail unaided.  She also toileted on [facility] with A for transfer.  She will continue to progress with skilled PT, needs to traverse steps for home entry prior to d/c.    If plan is discharge home, recommend the following: Help with stairs or ramp for entrance;Assistance with cooking/housework;Assist for transportation;A little help with walking and/or transfers;A lot of help with bathing/dressing/bathroom   Can travel by private vehicle        Equipment Recommendations  BSC/3in1    Recommendations for Other Services       Precautions / Restrictions Precautions Precautions: Fall Precaution Comments: abd binder for comfort with mobility Required Braces or Orthoses: Sling Restrictions Weight Bearing Restrictions Per Provider Order: Yes LUE Weight Bearing Per Provider Order: Non weight bearing     Mobility  Bed Mobility Overal bed mobility: Needs Assistance       Supine to sit: HOB elevated, Used rails, Supervision     General bed mobility comments: able to lift with R UE with HOB up using rail and moved legs off EOB    Transfers Overall transfer level: Needs assistance Equipment used: 1 person hand held assist Transfers: Sit to/from Stand Sit to Stand: Mod assist   Step pivot transfers: Mod assist       General transfer comment: some lifting help to stand; to [facility] initially    Ambulation/Gait Ambulation/Gait  assistance: Min assist Gait Distance (Feet): 20 Feet Assistive device: 1 person hand held assist Gait Pattern/deviations: Step-to pattern, Decreased stride length, Antalgic       General Gait Details: less stance time on L; holding PT's arm on R and slow but steady gait in room   Stairs             Wheelchair Mobility     Tilt Bed    Modified Rankin (Stroke Patients Only)       Balance Overall balance assessment: Needs assistance Sitting-balance support: Feet supported Sitting balance-Leahy Scale: Good       Standing balance-Leahy Scale: Poor Standing balance comment: reliant on therapist for support                            Cognition Arousal: Alert Behavior During Therapy: WFL for tasks assessed/performed Overall Cognitive Status: Within Functional Limits for tasks assessed                                          Exercises      General Comments General comments (skin integrity, edema, etc.): sister in the room and supportive, applied abdominal binder while on BSC as pt toileting on BSC initially      Pertinent Vitals/Pain Pain Assessment Pain Assessment: Faces Faces Pain Scale: Hurts even more Pain Location: lower back, L scapula Pain Descriptors /  Indicators: Grimacing, Guarding, Discomfort Pain Intervention(s): Monitored during session, RN gave pain meds during session    Home Living                          Prior Function            PT Goals (current goals can now be found in the care plan section) Progress towards PT goals: Progressing toward goals    Frequency    Min 1X/week      PT Plan      Co-evaluation              AM-PAC PT "6 Clicks" Mobility   Outcome Measure  Help needed turning from your back to your side while in a flat bed without using bedrails?: A Lot Help needed moving from lying on your back to sitting on the side of a flat bed without using bedrails?: A Lot Help  needed moving to and from a bed to a chair (including a wheelchair)?: A Little Help needed standing up from a chair using your arms (e.g., wheelchair or bedside chair)?: A Lot Help needed to walk in hospital room?: A Little Help needed climbing 3-5 steps with a railing? : Total 6 Click Score: 13    End of Session Equipment Utilized During Treatment: Other (comment) (sling, abd binder) Activity Tolerance: Patient tolerated treatment well Patient left: in chair;with call bell/phone within reach;with family/visitor present   PT Visit Diagnosis: Difficulty in walking, not elsewhere classified (R26.2);Pain Pain - Right/Left: Left Pain - part of body:  (ribs and back)     Time: 1135-1200 PT Time Calculation (min) (ACUTE ONLY): 25 min  Charges:    $Gait Training: 8-22 mins $Therapeutic Activity: 8-22 mins PT General Charges $$ ACUTE PT VISIT: 1 Visit                     Jo [name], PT Acute Rehabilitation Services Office:[phone] 05/17/2023    Jo [name] 05/17/2023, 5:26 PM

## 2023-05-17 NOTE — Progress Notes (Signed)
 Occupational Therapy Treatment Patient Details Name: Jo [name] MRN: [id number] DOB: 11/05/1990 Today's Date: 05/17/2023   History of present illness 33 yo female s/p sledding accident striking tree, sustained L rib fx 1, 3, 4-6; L PTX, L scapular fx in sling, grade 1 liver laceration, questionable small bowel mesenteric hematoma, and multiple lumbar TVP fx. S/P L chest tube placement 1/11. PMH unremarkable.   OT comments  Patient seated up in recliner and agreeable to OT session. Patient requiring mod assist to stand with assistance on right to ambulate to bathroom. Patient able to perform grooming tasks with setup seated at sink. Patient asking to return to bed with mod assist to return to supine with HOB elevated and assistance with BLEs and assistance for positioning in bed for comfort. Discharge recommendations continue to be appropriate. Acute OT to continue to follow to address bathing, dressing, grooming, and functional transfers.       If plan is discharge home, recommend the following:  A lot of help with walking and/or transfers;A lot of help with bathing/dressing/bathroom;Assistance with cooking/housework;Assist for transportation;Help with stairs or ramp for entrance   Equipment Recommendations  BSC/3in1    Recommendations for Other Services      Precautions / Restrictions Precautions Precautions: Fall Precaution Comments:  (rib and lumber TVP fx's on Left) Required Braces or Orthoses: Sling (ab binder) Restrictions Weight Bearing Restrictions Per Provider Order: Yes LUE Weight Bearing Per Provider Order: Non weight bearing       Mobility Bed Mobility Overal bed mobility: Needs Assistance Bed Mobility: Sit to Supine       Sit to supine: Mod assist, HOB elevated, Used rails   General bed mobility comments: assistance with BLEs and assistance with positioning in bed    Transfers Overall transfer level: Needs assistance Equipment used: 1 person hand held  assist Transfers: Sit to/from Stand, Bed to chair/wheelchair/BSC Sit to Stand: Mod assist     Step pivot transfers: Mod assist     General transfer comment: patient requiring HHA on right side, reliant on therapist for support     Balance Overall balance assessment: Needs assistance Sitting-balance support: Feet supported Sitting balance-Leahy Scale: Good Sitting balance - Comments: on EOB   Standing balance support: Single extremity supported Standing balance-Leahy Scale: Poor Standing balance comment: reliant on therapist for support                           ADL either performed or assessed with clinical judgement   ADL Overall ADL's : Needs assistance/impaired     Grooming: Wash/dry hands;Wash/dry face;Oral care;Set up;Sitting Grooming Details (indicate cue type and reason): at sink                                    Extremity/Trunk Assessment              Vision       Perception     Praxis      Cognition Arousal: Alert Behavior During Therapy: [facility] for tasks assessed/performed Overall Cognitive Status: Within Functional Limits for tasks assessed                                 General Comments: smiling on occasion, decreased complaints of pain        Exercises  Shoulder Instructions       General Comments patient's sister in room    Pertinent Vitals/ Pain       Pain Assessment Pain Assessment: Faces Faces Pain Scale: Hurts even more Pain Location: lower back, L scapula Pain Descriptors / Indicators: Grimacing, Guarding, Discomfort Pain Intervention(s): Limited activity within patient's tolerance, Monitored during session, Premedicated before session, Repositioned  Home Living                                          Prior Functioning/Environment              Frequency  Min 1X/week        Progress Toward Goals  OT Goals(current goals can now be found in the care  plan section)  Progress towards OT goals: Progressing toward goals  Acute Rehab OT Goals Patient Stated Goal: less pain OT Goal Formulation: With patient/family Time For Goal Achievement: 05/29/23 Potential to Achieve Goals: Good ADL Goals Pt Will Perform Grooming: with set-up;sitting Pt Will Perform Upper Body Dressing: with min assist;with caregiver independent in assisting;sitting Pt Will Perform Lower Body Dressing: with min assist;with caregiver independent in assisting;sit to/from stand Pt Will Transfer to Toilet: with contact guard assist;ambulating Pt Will Perform Toileting - Clothing Manipulation and hygiene: with contact guard assist;sitting/lateral leans Additional ADL Goal #1: Pt will perform bed mobility at min guard level prior to engaging in ADL  Plan      Co-evaluation                 AM-PAC OT "6 Clicks" Daily Activity     Outcome Measure   Help from another person eating meals?: A Little Help from another person taking care of personal grooming?: A Little Help from another person toileting, which includes using toliet, bedpan, or urinal?: A Lot Help from another person bathing (including washing, rinsing, drying)?: A Lot Help from another person to put on and taking off regular upper body clothing?: A Lot Help from another person to put on and taking off regular lower body clothing?: A Lot 6 Click Score: 14    End of Session    OT Visit Diagnosis: Unsteadiness on feet (R26.81);Other abnormalities of gait and mobility (R26.89);Muscle weakness (generalized) (M62.81)   Activity Tolerance Patient tolerated treatment well   Patient Left in bed;with call bell/phone within reach;with bed alarm set;with family/visitor present   Nurse Communication Mobility status;Precautions;Weight bearing status        Time: 1209-1227 OT Time Calculation (min): 18 min  Charges: OT General Charges $OT Visit: 1 Visit OT Treatments $Self Care/Home Management : 8-22  mins  Anitra Barn, OTA Acute Rehabilitation Services  Office [phone]   Jovita Nipper 05/17/2023, 1:52 PM

## 2023-05-17 NOTE — TOC Progression Note (Signed)
 Transition of Care [facility]) - Progression Note    Patient Details  Name: Jo [name] MRN: [id number] Date of Birth: [birth year]  Transition of Care [facility]) CM/SW Contact  Saheed Carrington M, RN Phone Number: 05/17/2023, 3:09 PM  Clinical Narrative:    Short term disability paperwork has been completed and signed by MD.  Patrick Boor returned to patient.    Expected Discharge Plan: OP Rehab Barriers to Discharge: Continued Medical Work up  Expected Discharge Plan and Services   Discharge Planning Services: CM Consult   Living arrangements for the past 2 months: Single Family Home                                       Social Determinants of Health (SDOH) Interventions SDOH Screenings   Food Insecurity: No Food Insecurity (05/13/2023)  Housing: Low Risk  (05/13/2023)  Transportation Needs: No Transportation Needs (05/13/2023)  Utilities: Not At Risk (05/13/2023)  Tobacco Use: Low Risk  (05/12/2023)    Readmission Risk Interventions     No data to display         Calla Catchings, RN, BSN  Trauma/Neuro ICU Case Manager [phone]

## 2023-05-18 ENCOUNTER — Other Ambulatory Visit (HOSPITAL_COMMUNITY): Payer: Self-pay

## 2023-05-18 MED ORDER — GABAPENTIN 300 MG PO CAPS
600.0000 mg | ORAL_CAPSULE | Freq: Three times a day (TID) | ORAL | 0 refills | Status: AC
  Filled 2023-05-18: qty 90, 15d supply, fill #0

## 2023-05-18 MED ORDER — LIDOCAINE 5 % EX PTCH
2.0000 | MEDICATED_PATCH | CUTANEOUS | 0 refills | Status: AC
  Filled 2023-05-18 – 2023-05-26 (×3): qty 30, 15d supply, fill #0

## 2023-05-18 MED ORDER — OXYCODONE HCL 10 MG PO TABS
10.0000 mg | ORAL_TABLET | Freq: Four times a day (QID) | ORAL | 0 refills | Status: AC | PRN
  Filled 2023-05-18: qty 25, 4d supply, fill #0

## 2023-05-18 MED ORDER — POLYETHYLENE GLYCOL 3350 17 G PO PACK: 17.0000 g | PACK | Freq: Every day | ORAL | Status: AC | PRN

## 2023-05-18 MED ORDER — DOCUSATE SODIUM 100 MG PO CAPS: 100.0000 mg | ORAL_CAPSULE | Freq: Two times a day (BID) | ORAL | Status: AC | PRN

## 2023-05-18 MED ORDER — ACETAMINOPHEN 500 MG PO TABS: 1000.0000 mg | ORAL_TABLET | Freq: Three times a day (TID) | ORAL | Status: AC | PRN

## 2023-05-18 MED ORDER — METHOCARBAMOL 500 MG PO TABS
1000.0000 mg | ORAL_TABLET | Freq: Four times a day (QID) | ORAL | 0 refills | Status: AC
  Filled 2023-05-18: qty 80, 10d supply, fill #0

## 2023-05-18 MED ORDER — TRAMADOL HCL 50 MG PO TABS
100.0000 mg | ORAL_TABLET | Freq: Four times a day (QID) | ORAL | 0 refills | Status: AC | PRN
  Filled 2023-05-18: qty 20, 4d supply, fill #0

## 2023-05-18 NOTE — Discharge Summary (Signed)
Patient ID: Jo [name] [id number] 1990-06-18 33 y.o.  Admit date: 05/12/2023 Discharge date: 05/18/2023  Admitting Diagnosis: Sledding and struck a tree Left rib fractures 1, 3, 4-6 Left pneumothorax Left scapular fracture  Grade 1 liver laceration Questionable small bowel mesenteric hematoma  Multiple lumbar transverse process fractures   Discharge Diagnosis Sledding and struck a tree Left rib fractures 1, 3, 4-6  Left pneumothorax Left scapular fracture  Grade 1 liver laceration Questionable small bowel mesenteric hematoma Multiple lumbar transverse process fractures +UPT and bhCG(quant) - downtrending, c/w recent pregnancy termination  Consultants Radiology  Orthopedics   HPI 82yo F was sledding with a friend on top of a golf cart hood when they struck a tree.  She felt like it knocked the breath out of her.  She had a lot of left-sided chest pain and [facility]  She was found to have multiple injuries and was accepted in transfer to the trauma service.  On arrival she complains of left chest pain and lower back pain with a lot of muscle spasms.  No shortness of breath.   Procedures Dr. Richarda Overlie - 05/13/23 - CT guided left chest tube placement   Hospital Course:  Patient presented as above. Found to have the below injuries.    Left rib fractures 1, 3, 4-6 - Treated with multimodal pain control and pulmonary toilet  Left pneumothorax - IR guided chest tube 1/11; no PTX on CXR, removed 1/14. CXR 1/15 no PTX  Left scapular fracture -Dr. Linna Caprice consulted;nonop mgmt planned - NWB LUE; sling  Grade 1 liver laceration - serial hgb's monitored and stable prior to discharge.   Questionable small bowel mesenteric hematoma - abdomen benign, no mesenteric injury seen on F/U CT  Multiple lumbar transverse process fractures - Tx w/ pain control and muscle relaxer  +UPT and bhCG(quant) - downtrending, c/w recent pregnancy  termination  Patient worked witht herapies during admission. Recommended for [facility] but unable to be arranged. Pt agreeable to outpatient PT/OT. Plans to with her mother and sister who can provide assistance at d/c. On 1/16 the patient was felt stable for d/c home. Discussed discharge instructions, restrictions and return/call back precautions. Follow up as noted below.   Physical Exam: Please see MD's progress note from earlier today  Allergies as of 05/18/2023   No Known Allergies      Medication List     STOP taking these medications    Advil Dual Action 125-250 MG Tabs Generic drug: Ibuprofen-Acetaminophen   Hydromet 5-1.5 MG/5ML syrup Generic drug: HYDROcodone bit-homatropine       TAKE these medications    acetaminophen 500 MG tablet Commonly known as: TYLENOL Take 2 tablets (1,000 mg total) by mouth every 8 (eight) hours as needed.   albuterol 108 (90 Base) MCG/ACT inhaler Commonly known as: VENTOLIN HFA Inhale 1-2 puffs into the lungs every 4 (four) hours as needed for wheezing or shortness of breath.   docusate sodium 100 MG capsule Commonly known as: COLACE Take 1 capsule (100 mg total) by mouth 2 (two) times daily as needed for mild constipation.   gabapentin 300 MG capsule Commonly known as: NEURONTIN Take 2 capsules (600 mg total) by mouth 3 (three) times daily.   Hailey 24 Fe 1-20 MG-MCG(24) tablet Generic drug: Norethindrone Acetate-Ethinyl Estrad-FE Take 1 tablet by mouth daily.   lidocaine 5 % Commonly known as: LIDODERM Place 2 patches onto the skin daily. Remove & Discard patch within 12  hours or as directed by MD Start taking on: May 19, 2023   methocarbamol 500 MG tablet Commonly known as: ROBAXIN Take 2 tablets (1,000 mg total) by mouth 4 (four) times daily.   Oxycodone HCl 10 MG Tabs Take 1-1.5 tablets (10-15 mg total) by mouth every 6 (six) hours as needed for breakthrough pain.   polyethylene glycol 17 g packet Commonly known as:  MIRALAX / GLYCOLAX Take 17 g by mouth daily as needed (constipation).   traMADol 50 MG tablet Commonly known as: ULTRAM Take 2 tablets (100 mg total) by mouth every 6 (six) hours as needed for severe pain (pain score 7-10).               Durable Medical Equipment  (From admission, onward)           Start     Ordered   05/18/23 0938  For home use only DME 3 n 1  Once        05/18/23 1610   05/18/23 0937  For home use only DME Bedside commode  Once       Question:  Patient needs a bedside commode to treat with the following condition  Answer:  Multiple fractures of ribs, left side, initial encounter for closed fracture   05/18/23 9604              Follow-up Information     Swinte[place], Arlys John, MD. Call in 1 day(s).   Specialty: Orthopedic Surgery Why: To arrange follow up. Please remain non weight bearing to your left upper extremity in a sling and follow up with Dr. Linna Caprice for your left scapular fracture Contact information: [address] [place] [address] [phone]         [facility] Follow up.   Why: We will call you with the results of your chest xray Contact information: [address] [place] [address] [phone]        [facility] Follow up.   Why: Please obtain a chest xray in 2 weeks (~06/01/23) Contact information: [address] [place] [address] [phone]                 Signed: Leary Roca, [facility] Surgery 05/18/2023, 3:09 PM Please see Amion for pager number during day hours 7:00am-4:30pm

## 2023-05-18 NOTE — Progress Notes (Signed)
PICC line removed per order and with no complications.  Pressure held to achieve hemostasis.  Vaseline/gauze/tegaderm applied.  Aftercare instructions reviewed including activity restriction and signs of infection.  Patient verbalized understanding and had no questions.

## 2023-05-18 NOTE — Progress Notes (Signed)
Patient ID: Jo [name], female   DOB: 10-02-1990, 33 y.o.   MRN: [id number]      Subjective: Moving better, did better with therapies yesterday ROS negative except as listed above. Objective: Vital signs in last 24 hours: Temp:  [97.6 F (36.4 C)-98.4 F (36.9 C)] 98.3 F (36.8 C) (01/16 0810) Pulse Rate:  [76-89] 89 (01/16 0311) Resp:  [15-20] 18 (01/16 0311) BP: (101-115)/(61-76) 105/69 (01/16 0810) SpO2:  [94 %-98 %] 96 % (01/16 0311) Last BM Date : 05/17/23  Intake/Output from previous day: No intake/output data recorded. Intake/Output this shift: No intake/output data recorded.  General appearance: alert and cooperative Resp: clear to auscultation bilaterally Chest wall: chest tube sites dressed GI: soft, NT Extremities: calves soft, sling LUE  Lab Results: CBC  Recent Labs    05/16/23 1124  WBC 4.9  HGB 11.5*  HCT 34.8*  PLT 258   BMET Recent Labs    05/16/23 1124 05/17/23 1133  NA 136 137  K 3.2* 3.7  CL 100 104  CO2 26 26  GLUCOSE 109* 126*  BUN 9 9  CREATININE 0.70 0.66  CALCIUM 8.9 8.6*   PT/INR No results for input(s): "LABPROT", "INR" in the last 72 hours. ABG No results for input(s): "PHART", "HCO3" in the last 72 hours.  Invalid input(s): "PCO2", "PO2"  Studies/Results: DG CHEST PORT 1 VIEW Result Date: 05/17/2023 CLINICAL DATA:  [id number] Encounter for chest tube removal [id number] EXAM: PORTABLE CHEST 1 VIEW COMPARISON:  05/16/2023 FINDINGS: Right PICC line is unchanged in positioning. Heart size is normal. Lungs are clear. No pleural effusion or pneumothorax. Redemonstration of multiple left-sided rib fractures. IMPRESSION: No pneumothorax.  Lungs remain clear. Electronically Signed   By: Duanne Guess D.O.   On: 05/17/2023 09:57   DG Chest 1 View Result Date: 05/16/2023 CLINICAL DATA:  [id number] Pneumothorax [id number] EXAM: PORTABLE CHEST 1 VIEW COMPARISON:  Chest XR, earlier same day and 05/15/2023. CT CAP, 05/13/2023 FINDINGS: Support  lines: RIGHT upper extremity PICC with catheter tip well-positioned within the SVC. Interval removal of LEFT pigtail thoracostomy. Cardiomediastinal silhouette is within normal limits. Lungs are well inflated. No focal consolidation or mass. No pleural effusion or pneumothorax. Similar appearance of displaced LEFT superior chest displaced fractures. No interval osseous abnormality. IMPRESSION: Interval removal of LEFT pigtail thoracostomy tube. No pneumothorax. Electronically Signed   By: Roanna Banning M.D.   On: 05/16/2023 14:16   DG Chest Port 1 View Result Date: 05/16/2023 CLINICAL DATA:  [id number] Respiratory failure (HCC) [id number] chest tube in place. EXAM: PORTABLE CHEST 1 VIEW COMPARISON:  Chest XR, 05/15/2023.  CT CAP, 05/13/2023. FINDINGS: Support lines: RIGHT upper extremity PICC with catheter tip well-positioned within the SVC. Stable positioning of LEFT basilar-directed pigtail thoracostomy. Cardiomediastinal silhouette is within normal limits. Lungs are well inflated. No focal consolidation or mass. No pleural effusion or pneumothorax. Similar appearance of displaced LEFT superior chest displaced fractures. No interval osseous abnormality. IMPRESSION: Stable positioning of LEFT basilar-directed pigtail thoracostomy tube. No pneumothorax. Additional lines and tubes, as above Electronically Signed   By: Roanna Banning M.D.   On: 05/16/2023 14:15    Anti-infectives: Anti-infectives (From admission, onward)    None       Assessment/Plan: Sledding and struck a tree   Left rib fractures 1, 3, 4-6 - Multimodal pain control and pulmonary toilet Left pneumothorax - IR guided chest tube 1/11; no PTX on CXR, removed 1/14. CXR 1/15 no PTX Left scapular fracture -Dr. Linna Caprice consulted;nonop mgmt planned -  NWB LUE; sling Grade 1 liver laceration Questionable small bowel mesenteric hematoma - abdomen benign, no mesenteric injury seen on F/U CT Multiple lumbar transverse process fractures -pain control and  muscle relaxer +UPT and bhCG(quant) - downtrending, c/w recent pregnancy termination FEN - reg diet, bowel regimen PPX: SCDs; LMWH Dispo - 4NP, likely home this PM. HH PT/OT ordered, DMEs ordered I spoke with her mother at the bedside as well.  LOS: 5 days    Jo Gelinas, MD, MPH, FACS Trauma & General Surgery Use [email or web address] to contact on call provider  05/18/2023

## 2023-05-18 NOTE — Discharge Instructions (Signed)
Please remain non weight bearing to your left upper extremity in a sling and follow up with Dr. Linna Caprice for your left scapular fracture

## 2023-05-18 NOTE — Progress Notes (Signed)
Pt discharge education and instructions completed with pt and family by [facility] RN Juliette Alcide. TOC pharmacy medications picked up and handed to pt. Pt discharged home with family to transport her home. Pt transported off unit via wheelchair with belongings and family to the side. Dionne Bucy RN

## 2023-05-18 NOTE — TOC Transition Note (Signed)
Transition of Care [facility]) - Discharge Note   Patient Details  Name: NOHA SHIPPS MRN: [id number] Date of Birth: [birth year]  Transition of Care [facility]) CM/SW Contact:  Glennon Mac, RN Phone Number: 05/18/2023, 12:17 PM   Clinical Narrative:    Patient for likely discharge home this afternoon with mother and sister to provide needed assistance at home.  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  She is agreeable to outpatient PT/OT in [place] at [facility].  BSC recommended, but patient declined, stating that she has a higher toilet in her bathroom that she will use.  Referrals made to [facility] in [place] for OP PT/OT.  No other dc needs identified.    Final next level of care: OP Rehab Barriers to Discharge: Barriers Resolved                       Discharge Plan and Services Additional resources added to the After Visit Summary for  NA   Discharge Planning Services: CM Consult                                 Social Drivers of Health (SDOH) Interventions SDOH Screenings   Food Insecurity: No Food Insecurity (05/13/2023)  Housing: Low Risk  (05/13/2023)  Transportation Needs: No Transportation Needs (05/13/2023)  Utilities: Not At Risk (05/13/2023)  Tobacco Use: Low Risk  (05/12/2023)     Readmission Risk Interventions     No data to display          Quintella Baton, RN, BSN  Trauma/Neuro ICU Case Manager [phone]

## 2023-05-18 NOTE — Progress Notes (Signed)
Physical Therapy Treatment Patient Details Name: Jo [name] MRN: [id number] DOB: 10/18/1990 Today's Date: 05/18/2023   History of Present Illness 33 yo female s/p sledding accident striking tree, sustained L rib fx 1, 3, 4-6; L PTX, L scapular fx in sling, grade 1 liver laceration, questionable small bowel mesenteric hematoma, and multiple lumbar TVP fx. S/P L chest tube placement 1/11. PMH unremarkable.    PT Comments  Patient progressing now ambulating to bathroom with mother support.  Able to negotiate stairs this session appropriate for home entry.  Mother present and verbalized understanding how to help on stairs.  Discussed slow progression of activity and plans for HHPT follow up for progression at home and for addressing any difficulties with mobility at home.  Anticipate home today per patient.      If plan is discharge home, recommend the following: Help with stairs or ramp for entrance;Assistance with cooking/housework;Assist for transportation;A little help with walking and/or transfers;A lot of help with bathing/dressing/bathroom   Can travel by private vehicle        Equipment Recommendations  BSC/3in1    Recommendations for Other Services       Precautions / Restrictions Precautions Precautions: Fall Precaution Comments: abd binder for comfort with mobility Required Braces or Orthoses: Sling Restrictions LUE Weight Bearing Per Provider Order: Non weight bearing     Mobility  Bed Mobility         Supine to sit: HOB elevated, Used rails, Supervision Sit to supine: Supervision, HOB elevated, Used rails        Transfers Overall transfer level: Needs assistance Equipment used: 1 person hand held assist Transfers: Sit to/from Stand Sit to Stand: Min assist           General transfer comment: mother has also been helping her up to bathroom overnight, had shower this am    Ambulation/Gait Ambulation/Gait assistance: Min assist Gait Distance  (Feet): 20 Feet Assistive device: 1 person hand held assist Gait Pattern/deviations: Step-through pattern, Decreased stride length, Trunk flexed       General Gait Details: reliant on R UE support from mother or PT; asked about a crutch, pt declined   Stairs Stairs: Yes Stairs assistance: Contact guard assist, Min assist Stair Management: One rail Right, Step to pattern, Forwards Number of Stairs: 3 General stair comments: demonstrated technique in stairwell and then pt performed, assist for balance then to transition to opposite rail to descend steps   Wheelchair Mobility     Tilt Bed    Modified Rankin (Stroke Patients Only)       Balance Overall balance assessment: Modified Independent         Standing balance support: Single extremity supported Standing balance-Leahy Scale: Poor Standing balance comment: still reliant on single UE support more due to pain                            Cognition Arousal: Alert   Overall Cognitive Status: Within Functional Limits for tasks assessed                                          Exercises      General Comments General comments (skin integrity, edema, etc.): mother in the room and assisting with walking to bathoom, had abdominal binder and pt relates felt a little better when wearing it, but  forgot to don prior to stairs, used wheelchair to get to stairwell      Pertinent Vitals/Pain Pain Assessment Pain Assessment: Faces Faces Pain Scale: Hurts little more Pain Location: lower back Pain Descriptors / Indicators: Grimacing, Guarding Pain Intervention(s): Monitored during session, Repositioned    Home Living                          Prior Function            PT Goals (current goals can now be found in the care plan section) Progress towards PT goals: Progressing toward goals    Frequency    Min 1X/week      PT Plan      Co-evaluation               AM-PAC PT "6 Clicks" Mobility   Outcome Measure  Help needed turning from your back to your side while in a flat bed without using bedrails?: A Little Help needed moving from lying on your back to sitting on the side of a flat bed without using bedrails?: A Little Help needed moving to and from a bed to a chair (including a wheelchair)?: A Little Help needed standing up from a chair using your arms (e.g., wheelchair or bedside chair)?: A Little Help needed to walk in hospital room?: A Little Help needed climbing 3-5 steps with a railing? : A Little 6 Click Score: 18    End of Session Equipment Utilized During Treatment: Other (comment) (sling) Activity Tolerance: Patient limited by pain Patient left: in bed;with call bell/phone within reach;with family/visitor present   PT Visit Diagnosis: Difficulty in walking, not elsewhere classified (R26.2);Pain Pain - Right/Left: Left Pain - part of body:  (ribs and back)     Time: 1240-1300 PT Time Calculation (min) (ACUTE ONLY): 20 min  Charges:    $Gait Training: 8-22 mins                       Sheran Lawless, PT Acute Rehabilitation Services Office:[phone] 05/18/2023    Jo [name] 05/18/2023, 1:25 PM

## 2023-05-26 ENCOUNTER — Other Ambulatory Visit (HOSPITAL_COMMUNITY): Payer: Self-pay

## 2023-05-26 ENCOUNTER — Other Ambulatory Visit: Payer: Self-pay

## 2023-05-26 ENCOUNTER — Other Ambulatory Visit (HOSPITAL_BASED_OUTPATIENT_CLINIC_OR_DEPARTMENT_OTHER): Payer: Self-pay | Admitting: Physician Assistant

## 2023-05-26 ENCOUNTER — Other Ambulatory Visit (HOSPITAL_BASED_OUTPATIENT_CLINIC_OR_DEPARTMENT_OTHER): Payer: Self-pay

## 2023-05-29 ENCOUNTER — Other Ambulatory Visit (HOSPITAL_COMMUNITY): Payer: Self-pay

## 2023-06-01 ENCOUNTER — Other Ambulatory Visit (HOSPITAL_COMMUNITY): Payer: Self-pay | Admitting: Physician Assistant

## 2023-06-01 ENCOUNTER — Other Ambulatory Visit (HOSPITAL_COMMUNITY): Payer: Self-pay

## 2023-06-02 ENCOUNTER — Ambulatory Visit
Admission: RE | Admit: 2023-06-02 | Discharge: 2023-06-02 | Disposition: A | Discharge status: Home / Self Care | Payer: 59 | Source: Ambulatory Visit | Attending: Physician Assistant | Admitting: Physician Assistant

## 2023-06-02 DIAGNOSIS — J939 Pneumothorax, unspecified: Secondary | ICD-10-CM

## 2023-06-06 ENCOUNTER — Other Ambulatory Visit (HOSPITAL_COMMUNITY): Payer: Self-pay

## 2023-06-07 ENCOUNTER — Telehealth: Payer: Self-pay | Admitting: Physician Assistant

## 2023-06-07 NOTE — Telephone Encounter (Signed)
 Called patient update on results of chest x-ray.  This showed no active cardiopulmonary disease.  She does not need any further chest imaging or follow-up in trauma clinic from our standpoint.  She had questions regarding follow-up and precautions with liver laceration. She had a G1 liver laceration. We reviewed to avoid all activities that could lead to abdominal trauma for 6 weeks from time of injury.  No direct follow-up needed but can be seen in the office if she has any issues regarding this.  Patient also reports that she has left lower back pain with some tingling going down the back of her left leg to the level of her knee that has been present/stable since she was in the hospital.  Denies any fever, bowel/bladder dysfunction, saddle anesthesia, lower leg swelling, unilateral leg swelling, lower extremity erythema, or weakness.  She confirmed again that this is stable from admission and not worsening.  She did have a CT during admission that showed left L1-L4 transverse process fractures with associated left psoas hematoma. She is already on gabapentin  and muscle relaxer's. Recommend close follow up with PCP (confirms she has PCP) for evaluation. We reviewed strict precautions to go to the ED for further evaluation. She stated understanding and agreement with this plan.   Yuridiana Formanek M Quinlin Conant, PA-C

## 2023-06-09 ENCOUNTER — Other Ambulatory Visit: Payer: Self-pay

## 2023-06-19 ENCOUNTER — Other Ambulatory Visit (HOSPITAL_COMMUNITY): Payer: Self-pay

## 2023-08-19 ENCOUNTER — Other Ambulatory Visit (HOSPITAL_COMMUNITY): Payer: Self-pay

## 2024-03-04 ENCOUNTER — Encounter (HOSPITAL_COMMUNITY): Payer: Self-pay | Admitting: Obstetrics and Gynecology

## 2024-03-04 NOTE — Progress Notes (Signed)
 Spoke w/ via phone for pre-op interview--- Jo [name] needs dos---- CBC and UPT per surgeon.         [name] results------ COVID test -----patient states asymptomatic no test needed Arrive at -------0830 NPO after MN NO Solid Food.   Pre-Surgery Ensure or G2:  Med rec completed Medications to take morning of surgery ----- Bring Albuterol inhaler Diabetic medication -----  GLP1 agonist last dose: GLP1 instructions:  Patient instructed no nail polish to be worn day of surgery Patient instructed to bring photo id and insurance card day of surgery Patient aware to have Driver (ride ) / caregiver    for 24 hours after surgery - Mother Jo [name] Patient Special Instructions ----- Pre-Op special Instructions -----  Patient verbalized understanding of instructions that were given at this phone interview. Patient denies chest pain, sob, fever, cough at the interview.

## 2024-03-06 NOTE — H&P (Signed)
 Jo [name] is an 33 y.o. female. At her yearly exam in mid- October, she expressed her wish to proceed with surgery for permanent sterility  Pertinent Gynecological History: Last pap: normal Date: 01/2024 OB History: G2, P2002 LTCS x 2   Menstrual History: Patient's last menstrual period was 03/03/2024 (exact date).    Past Medical History:  Diagnosis Date   GERD (gastroesophageal reflux disease)     Past Surgical History:  Procedure Laterality Date   CESAREAN SECTION N/A 03/27/2019   Procedure: CESAREAN SECTION;  Surgeon: Jo Lavonia HERO, MD;  Location: [facility] LD ORS;  Service: Obstetrics;  Laterality: N/A;   CESAREAN SECTION N/A 08/31/2020   Procedure: REPEAT CESAREAN SECTION;  Surgeon: Jo Boas, MD;  Location: [facility] LD ORS;  Service: Obstetrics;  Laterality: N/A;    Family History  Problem Relation Age of Onset   Hypertension Other     Social History:  reports that she has never smoked. She has never used smokeless tobacco. She reports that she does not currently use alcohol. She reports that she does not currently use drugs.  Allergies: No Known Allergies  No medications prior to admission.    Review of Systems  Respiratory: Negative.    Cardiovascular: Negative.     Height 5' 7 (1.702 m), weight 64 kg, last menstrual period 03/03/2024, not currently breastfeeding. Physical Exam Constitutional:      Appearance: Normal appearance.  Cardiovascular:     Rate and Rhythm: Normal rate and regular rhythm.     Heart sounds: Normal heart sounds. No murmur heard. Pulmonary:     Effort: Pulmonary effort is normal. No respiratory distress.     Breath sounds: Normal breath sounds. No wheezing.  Abdominal:     General: There is no distension.     Palpations: Abdomen is soft. There is no mass.     Tenderness: There is no abdominal tenderness.  Genitourinary:    General: Normal vulva.     Comments: Uterus normal size No adnexal mass Musculoskeletal:     Cervical  back: Normal range of motion and neck supple.  Neurological:     Mental Status: She is alert.     No results found for this or any previous visit (from the past 24 hours).  No results found.  Assessment/Plan: Desires permanent sterility. All options for contraception discussed. Surgical procedure, risks, alternatives, permanency, failure rate all discussed, questions answered. Will admit for laparoscopic bilateral salpingectomy  Jo [name] 03/06/2024, 7:09 PM

## 2024-03-07 ENCOUNTER — Ambulatory Visit (HOSPITAL_COMMUNITY)
Admission: RE | Admit: 2024-03-07 | Discharge: 2024-03-07 | Disposition: A | Discharge status: Home / Self Care | Attending: Obstetrics and Gynecology | Admitting: Obstetrics and Gynecology

## 2024-03-07 ENCOUNTER — Encounter (HOSPITAL_COMMUNITY): Payer: Self-pay | Admitting: Obstetrics and Gynecology

## 2024-03-07 ENCOUNTER — Encounter (HOSPITAL_COMMUNITY)
Admission: RE | Disposition: A | Discharge status: Home / Self Care | Payer: Self-pay | Source: Home / Self Care | Attending: Obstetrics and Gynecology

## 2024-03-07 ENCOUNTER — Other Ambulatory Visit: Payer: Self-pay

## 2024-03-07 ENCOUNTER — Ambulatory Visit (HOSPITAL_COMMUNITY): Admitting: Anesthesiology

## 2024-03-07 DIAGNOSIS — Z302 Encounter for sterilization: Secondary | ICD-10-CM

## 2024-03-07 DIAGNOSIS — J4489 Other specified chronic obstructive pulmonary disease: Secondary | ICD-10-CM | POA: Diagnosis not present

## 2024-03-07 HISTORY — PX: LAPAROSCOPIC BILATERAL SALPINGECTOMY: SHX5889

## 2024-03-07 LAB — CBC
HCT: 37.8 % (ref 36.0–46.0)
Hemoglobin: 12.7 g/dL (ref 12.0–15.0)
MCH: 32.1 pg (ref 26.0–34.0)
MCHC: 33.6 g/dL (ref 30.0–36.0)
MCV: 95.5 fL (ref 80.0–100.0)
Platelets: 276 K/uL (ref 150–400)
RBC: 3.96 MIL/uL (ref 3.87–5.11)
RDW: 11.3 % — ABNORMAL LOW (ref 11.5–15.5)
WBC: 5.3 K/uL (ref 4.0–10.5)
nRBC: 0 % (ref 0.0–0.2)

## 2024-03-07 LAB — POCT PREGNANCY, URINE: Preg Test, Ur: NEGATIVE

## 2024-03-07 SURGERY — SALPINGECTOMY, BILATERAL, LAPAROSCOPIC
Anesthesia: General | Site: Pelvis | Laterality: Bilateral

## 2024-03-07 MED ORDER — ONDANSETRON HCL 4 MG/2ML IJ SOLN
INTRAMUSCULAR | Status: DC | PRN
  Administered 2024-03-07: 4 mg via INTRAVENOUS

## 2024-03-07 MED ORDER — ONDANSETRON HCL 4 MG/2ML IJ SOLN
INTRAMUSCULAR | Status: AC
  Filled 2024-03-07: qty 2

## 2024-03-07 MED ORDER — ROCURONIUM BROMIDE 10 MG/ML (PF) SYRINGE
PREFILLED_SYRINGE | INTRAVENOUS | Status: AC
Start: 2024-03-07 — End: 2024-03-07
  Filled 2024-03-07: qty 10

## 2024-03-07 MED ORDER — HYDROCODONE-ACETAMINOPHEN 5-325 MG PO TABS: 1.0000 | ORAL_TABLET | Freq: Four times a day (QID) | ORAL | 0 refills | Status: AC | PRN

## 2024-03-07 MED ORDER — PROPOFOL 10 MG/ML IV BOLUS
INTRAVENOUS | Status: DC | PRN
  Administered 2024-03-07: 130 mg via INTRAVENOUS

## 2024-03-07 MED ORDER — KETOROLAC TROMETHAMINE 30 MG/ML IJ SOLN
INTRAMUSCULAR | Status: AC
  Filled 2024-03-07: qty 1

## 2024-03-07 MED ORDER — PHENYLEPHRINE 80 MCG/ML (10ML) SYRINGE FOR IV PUSH (FOR BLOOD PRESSURE SUPPORT)
PREFILLED_SYRINGE | INTRAVENOUS | Status: AC
Start: 2024-03-07 — End: 2024-03-07
  Filled 2024-03-07: qty 10

## 2024-03-07 MED ORDER — KETOROLAC TROMETHAMINE 30 MG/ML IJ SOLN
INTRAMUSCULAR | Status: DC | PRN
  Administered 2024-03-07: 30 mg via INTRAVENOUS

## 2024-03-07 MED ORDER — PROPOFOL 10 MG/ML IV BOLUS
INTRAVENOUS | Status: AC
  Filled 2024-03-07: qty 20

## 2024-03-07 MED ORDER — SCOPOLAMINE 1 MG/3DAYS TD PT72
MEDICATED_PATCH | TRANSDERMAL | Status: DC
Start: 2024-03-07 — End: 2024-03-07
  Filled 2024-03-07: qty 1

## 2024-03-07 MED ORDER — ONDANSETRON HCL 4 MG/2ML IJ SOLN: INTRAMUSCULAR | Status: DC | PRN

## 2024-03-07 MED ORDER — CHLORHEXIDINE GLUCONATE 0.12 % MT SOLN
OROMUCOSAL | Status: DC
Start: 2024-03-07 — End: 2024-03-07
  Filled 2024-03-07: qty 15

## 2024-03-07 MED ORDER — KETOROLAC TROMETHAMINE 30 MG/ML IJ SOLN
INTRAMUSCULAR | Status: AC
Start: 2024-03-07 — End: 2024-03-07
  Filled 2024-03-07: qty 1

## 2024-03-07 MED ORDER — OXYCODONE HCL 5 MG/5ML PO SOLN: 5.0000 mg | Freq: Once | ORAL | Status: AC | PRN

## 2024-03-07 MED ORDER — ACETAMINOPHEN 500 MG PO TABS
ORAL_TABLET | ORAL | Status: AC
  Filled 2024-03-07: qty 2

## 2024-03-07 MED ORDER — CHLORHEXIDINE GLUCONATE 0.12 % MT SOLN
15.0000 mL | Freq: Once | OROMUCOSAL | Status: AC
  Administered 2024-03-07: 15 mL via OROMUCOSAL

## 2024-03-07 MED ORDER — OXYCODONE HCL 5 MG PO TABS
ORAL_TABLET | ORAL | Status: AC
  Filled 2024-03-07: qty 1

## 2024-03-07 MED ORDER — HYDROMORPHONE HCL 1 MG/ML IJ SOLN
0.2500 mg | INTRAMUSCULAR | Status: DC | PRN
  Administered 2024-03-07 (×2): 0.5 mg via INTRAVENOUS

## 2024-03-07 MED ORDER — DEXMEDETOMIDINE HCL IN NACL 80 MCG/20ML IV SOLN
INTRAVENOUS | Status: DC | PRN
  Administered 2024-03-07: 12 ug via INTRAVENOUS

## 2024-03-07 MED ORDER — MIDAZOLAM HCL (PF) 2 MG/2ML IJ SOLN: 0.5000 mg | Freq: Once | INTRAMUSCULAR | Status: DC | PRN

## 2024-03-07 MED ORDER — FENTANYL CITRATE (PF) 250 MCG/5ML IJ SOLN
INTRAMUSCULAR | Status: DC | PRN
  Administered 2024-03-07: 150 ug via INTRAVENOUS
  Administered 2024-03-07: 100 ug via INTRAVENOUS

## 2024-03-07 MED ORDER — LACTATED RINGERS IV SOLN
INTRAVENOUS | Status: DC
  Administered 2024-03-07: 1000 mL via INTRAVENOUS

## 2024-03-07 MED ORDER — ORAL CARE MOUTH RINSE: 15.0000 mL | Freq: Once | OROMUCOSAL | Status: AC

## 2024-03-07 MED ORDER — LIDOCAINE 2% (20 MG/ML) 5 ML SYRINGE
INTRAMUSCULAR | Status: AC
Start: 2024-03-07 — End: 2024-03-07
  Filled 2024-03-07: qty 5

## 2024-03-07 MED ORDER — MIDAZOLAM HCL 2 MG/2ML IJ SOLN
INTRAMUSCULAR | Status: AC
Start: 2024-03-07 — End: 2024-03-07
  Filled 2024-03-07: qty 2

## 2024-03-07 MED ORDER — EPHEDRINE 5 MG/ML INJ
INTRAVENOUS | Status: AC
  Filled 2024-03-07: qty 5

## 2024-03-07 MED ORDER — OXYCODONE HCL 5 MG PO TABS
5.0000 mg | ORAL_TABLET | Freq: Once | ORAL | Status: AC | PRN
  Administered 2024-03-07: 5 mg via ORAL

## 2024-03-07 MED ORDER — FENTANYL CITRATE (PF) 250 MCG/5ML IJ SOLN
INTRAMUSCULAR | Status: AC
  Filled 2024-03-07: qty 5

## 2024-03-07 MED ORDER — EPHEDRINE SULFATE-NACL 50-0.9 MG/10ML-% IV SOSY
PREFILLED_SYRINGE | INTRAVENOUS | Status: DC | PRN
  Administered 2024-03-07 (×3): 5 mg via INTRAVENOUS

## 2024-03-07 MED ORDER — LIDOCAINE 2% (20 MG/ML) 5 ML SYRINGE
INTRAMUSCULAR | Status: DC | PRN
  Administered 2024-03-07: 20 mg via INTRAVENOUS

## 2024-03-07 MED ORDER — SODIUM CHLORIDE 0.9 % IR SOLN
Status: DC | PRN
  Administered 2024-03-07: 1000 mL

## 2024-03-07 MED ORDER — SUGAMMADEX SODIUM 200 MG/2ML IV SOLN
INTRAVENOUS | Status: DC | PRN
  Administered 2024-03-07: 200 mg via INTRAVENOUS

## 2024-03-07 MED ORDER — BUPIVACAINE HCL (PF) 0.25 % IJ SOLN
INTRAMUSCULAR | Status: DC | PRN
Start: 2024-03-07 — End: 2024-03-07
  Administered 2024-03-07: 10 mL

## 2024-03-07 MED ORDER — HYDROMORPHONE HCL 1 MG/ML IJ SOLN
INTRAMUSCULAR | Status: AC
  Filled 2024-03-07: qty 1

## 2024-03-07 MED ORDER — DEXAMETHASONE SOD PHOSPHATE PF 10 MG/ML IJ SOLN
INTRAMUSCULAR | Status: DC | PRN
  Administered 2024-03-07: 10 mg via INTRAVENOUS

## 2024-03-07 MED ORDER — SCOPOLAMINE 1 MG/3DAYS TD PT72
1.0000 | MEDICATED_PATCH | TRANSDERMAL | Status: DC
  Administered 2024-03-07: 1 mg via TRANSDERMAL

## 2024-03-07 MED ORDER — ROCURONIUM BROMIDE 10 MG/ML (PF) SYRINGE
PREFILLED_SYRINGE | INTRAVENOUS | Status: DC | PRN
  Administered 2024-03-07: 50 mg via INTRAVENOUS

## 2024-03-07 MED ORDER — LACTATED RINGERS IV SOLN: INTRAVENOUS | Status: DC

## 2024-03-07 MED ORDER — MEPERIDINE HCL 50 MG/ML IJ SOLN
6.2500 mg | INTRAMUSCULAR | Status: DC | PRN
  Filled 2024-03-07: qty 1

## 2024-03-07 MED ORDER — ACETAMINOPHEN 500 MG PO TABS
1000.0000 mg | ORAL_TABLET | Freq: Once | ORAL | Status: AC
  Administered 2024-03-07: 1000 mg via ORAL

## 2024-03-07 MED ORDER — MIDAZOLAM HCL (PF) 2 MG/2ML IJ SOLN
INTRAMUSCULAR | Status: DC | PRN
  Administered 2024-03-07: 2 mg via INTRAVENOUS

## 2024-03-07 SURGICAL SUPPLY — 29 items
CATH ROBINSON RED A/P 16FR (CATHETERS) IMPLANT
DERMABOND ADVANCED .7 DNX12 (GAUZE/BANDAGES/DRESSINGS) ×1 IMPLANT
DRAPE SURG IRRIG POUCH 19X23 (DRAPES) ×1 IMPLANT
DRSG OPSITE POSTOP 3X4 (GAUZE/BANDAGES/DRESSINGS) IMPLANT
DURAPREP 26ML APPLICATOR (WOUND CARE) ×1 IMPLANT
GLOVE BIOGEL PI IND STRL 7.0 (GLOVE) ×2 IMPLANT
GLOVE ORTHO TXT STRL SZ7.5 (GLOVE) ×1 IMPLANT
GOWN STRL REUS W/ TWL XL LVL3 (GOWN DISPOSABLE) ×1 IMPLANT
IRRIGATION SUCT STRKRFLW 2 WTP (MISCELLANEOUS) IMPLANT
KIT PINK PAD W/HEAD ARM REST (MISCELLANEOUS) ×1 IMPLANT
KIT TURNOVER KIT B (KITS) ×1 IMPLANT
NDL INSUFFLATION 14GA 120MM (NEEDLE) ×1 IMPLANT
NEEDLE INSUFFLATION 14GA 120MM (NEEDLE) ×1 IMPLANT
PACK LAPAROSCOPY BASIN (CUSTOM PROCEDURE TRAY) ×1 IMPLANT
SCISSORS LAP 5X45 EPIX DISP (ENDOMECHANICALS) IMPLANT
SET TUBE SMOKE EVAC HIGH FLOW (TUBING) ×1 IMPLANT
SHEARS HARMONIC 36 ACE (MISCELLANEOUS) IMPLANT
SLEEVE Z-THREAD 5X100MM (TROCAR) IMPLANT
SOLN 0.9% NACL POUR BTL 1000ML (IV SOLUTION) ×1 IMPLANT
SOLUTION ELECTROSURG ANTI STCK (MISCELLANEOUS) IMPLANT
SPIKE FLUID TRANSFER (MISCELLANEOUS) ×1 IMPLANT
SUT VIC AB 4-0 PS2 18 (SUTURE) ×1 IMPLANT
SUT VICRYL 0 UR6 27IN ABS (SUTURE) ×1 IMPLANT
SYSTEM BAG RETRIEVAL 10MM (BASKET) IMPLANT
TOWEL GREEN STERILE FF (TOWEL DISPOSABLE) ×1 IMPLANT
TRAY FOLEY W/BAG SLVR 14FR (SET/KITS/TRAYS/PACK) IMPLANT
TROCAR 11X100 Z THREAD (TROCAR) ×1 IMPLANT
TROCAR ADV FIXATION 5X100MM (TROCAR) IMPLANT
WARMER LAPAROSCOPE (MISCELLANEOUS) ×1 IMPLANT

## 2024-03-07 NOTE — Transfer of Care (Signed)
 Immediate Anesthesia Transfer of Care Note  Patient: Jo [name]  Procedure(s) Performed: SALPINGECTOMY, BILATERAL, LAPAROSCOPIC (Bilateral: Pelvis)  Patient Location: PACU  Anesthesia Type:General  Level of Consciousness: awake, alert , patient cooperative, and responds to stimulation  Airway & Oxygen Therapy: Patient Spontanous Breathing and Patient connected to face mask oxygen  Post-op Assessment: Report given to RN, Post -op Vital signs reviewed and stable, and Patient moving all extremities X 4  Post vital signs: Reviewed and stable  Last Vitals:  Vitals Value Taken Time  BP 102/86 03/07/24 12:56  Temp    Pulse 87 03/07/24 12:58  Resp 17 03/07/24 12:58  SpO2 99 % 03/07/24 12:58  Vitals shown include unfiled device data.  Last Pain:  Vitals:   03/07/24 0909  TempSrc:   PainSc: 0-No pain      Patients Stated Pain Goal: 4 (03/07/24 0909)  Complications: No notable events documented.

## 2024-03-07 NOTE — Anesthesia Procedure Notes (Signed)
 Procedure Name: Intubation Date/Time: 03/07/2024 11:51 AM  Performed by: Jolynn Mage, CRNAPre-anesthesia Checklist: Patient identified, Patient being monitored, Timeout performed, Emergency Drugs available and Suction available Patient Re-evaluated:Patient Re-evaluated prior to induction Oxygen Delivery Method: Circle System Utilized Preoxygenation: Pre-oxygenation with 100% oxygen Induction Type: IV induction Ventilation: Mask ventilation without difficulty Laryngoscope Size: Miller and 2 Grade View: Grade I Tube type: Oral Tube size: 7.0 mm Number of attempts: 1 Airway Equipment and Method: Stylet Placement Confirmation: ETT inserted through vocal cords under direct vision, positive ETCO2 and breath sounds checked- equal and bilateral Secured at: 21 cm Tube secured with: Tape Dental Injury: Teeth and Oropharynx as per pre-operative assessment

## 2024-03-07 NOTE — Interval H&P Note (Signed)
 History and Physical Interval Note:  03/07/2024 10:54 AM  Jo [name]  has presented today for surgery, with the diagnosis of sterilization.  The various methods of treatment have been discussed with the patient and family. After consideration of risks, benefits and other options for treatment, the patient has consented to  Procedure(s): SALPINGECTOMY, BILATERAL, LAPAROSCOPIC (Bilateral) as a surgical intervention.  The patient's history has been reviewed, patient examined, no change in status, stable for surgery.  I have reviewed the patient's chart and labs.  Questions were answered to the patient's satisfaction.     Krystal BIRCH Keenan Trefry

## 2024-03-07 NOTE — Discharge Instructions (Addendum)
Routine instructions for laparoscopy ° ° °Post Anesthesia Home Care Instructions ° °Activity: °Get plenty of rest for the remainder of the day. A responsible adult should stay with you for 24 hours following the procedure.  °For the next 24 hours, DO NOT: °-Drive a car °-Operate machinery °-Drink alcoholic beverages °-Take any medication unless instructed by your physician °-Make any legal decisions or sign important papers. ° °Meals: °Start with liquid foods such as gelatin or soup. Progress to regular foods as tolerated. Avoid greasy, spicy, heavy foods. If nausea and/or vomiting occur, drink only clear liquids until the nausea and/or vomiting subsides. Call your physician if vomiting continues. ° °Special Instructions/Symptoms: °Your throat may feel dry or sore from the anesthesia or the breathing tube placed in your throat during surgery. If this causes discomfort, gargle with warm salt water. The discomfort should disappear within 24 hours. ° °If you had a scopolamine patch placed behind your ear for the management of post- operative nausea and/or vomiting: ° °1. The medication in the patch is effective for 72 hours, after which it should be removed.  Wrap patch in a tissue and discard in the trash. Wash hands thoroughly with soap and water. °2. You may remove the patch earlier than 72 hours if you experience unpleasant side effects which may include dry mouth, dizziness or visual disturbances. °3. Avoid touching the patch. Wash your hands with soap and water after contact with the patch. °  ° °

## 2024-03-07 NOTE — Anesthesia Preprocedure Evaluation (Addendum)
 Anesthesia Evaluation  Patient identified by MRN, date of birth, ID band Patient awake    Reviewed: Allergy & Precautions, NPO status , Patient's Chart, lab work & pertinent test results  History of Anesthesia Complications Negative for: history of anesthetic complications  Airway Mallampati: I  TM Distance: >3 FB Neck ROM: Full    Dental  (+) Dental Advisory Given   Pulmonary asthma , COPD,  COPD inhaler, Current Smoker and Patient abstained from smoking.   breath sounds clear to auscultation       Cardiovascular negative cardio ROS  Rhythm:Regular Rate:Normal     Neuro/Psych negative neurological ROS     GI/Hepatic Neg liver ROS,GERD  Controlled,,  Endo/Other  negative endocrine ROS    Renal/GU negative Renal ROS     Musculoskeletal   Abdominal   Peds  Hematology Hb 12.7, plt 276k   Anesthesia Other Findings   Reproductive/Obstetrics                              Anesthesia Physical Anesthesia Plan  ASA: 2  Anesthesia Plan: General   Post-op Pain Management: Tylenol  PO (pre-op)*   Induction: Intravenous  PONV Risk Score and Plan: 3 and Ondansetron , Dexamethasone  and Scopolamine  patch - Pre-op  Airway Management Planned: Oral ETT  Additional Equipment: None  Intra-op Plan:   Post-operative Plan: Extubation in OR  Informed Consent: I have reviewed the patients History and Physical, chart, labs and discussed the procedure including the risks, benefits and alternatives for the proposed anesthesia with the patient or authorized representative who has indicated his/her understanding and acceptance.     Dental advisory given  Plan Discussed with: CRNA and Surgeon  Anesthesia Plan Comments:          Anesthesia Quick Evaluation

## 2024-03-07 NOTE — Anesthesia Postprocedure Evaluation (Signed)
 Anesthesia Post Note  Patient: Jo [name]  Procedure(s) Performed: SALPINGECTOMY, BILATERAL, LAPAROSCOPIC (Bilateral: Pelvis)     Patient location during evaluation: PACU Anesthesia Type: General Level of consciousness: awake and alert, oriented and patient cooperative Pain management: pain level controlled Vital Signs Assessment: post-procedure vital signs reviewed and stable Respiratory status: spontaneous breathing, nonlabored ventilation and respiratory function stable Cardiovascular status: blood pressure returned to baseline and stable Postop Assessment: no apparent nausea or vomiting and able to ambulate Anesthetic complications: no   No notable events documented.  Last Vitals:  Vitals:   03/07/24 1325 03/07/24 1348  BP:    Pulse: 62 95  Resp: 11 (!) 22  Temp:  36.6 C  SpO2: 97% 100%    Last Pain:  Vitals:   03/07/24 1426  TempSrc:   PainSc: 4                  Suleika Donavan,E. Kris No

## 2024-03-07 NOTE — Op Note (Signed)
 Preoperative diagnosis: Desires surgical sterility Postoperative diagnosis: Same Procedure: Laparoscopic bilateral salpingectomy Surgeon: Krystal Deaner M.D. Anesthesia: Gen. Endotracheal tube Findings: She had a normal abdomen and pelvis with normal uterus tubes and ovaries Specimens: Bilateral fallopian tubes Estimated blood loss: Minimal Complications: None  Procedure in detail  The patient was taken to the operating room and placed in the dorsosupine position. General anesthesia was induced. Her legs were placed in mobile stirrups and her left arm was tucked to her side. Abdomen perineum and vagina were then prepped and draped in the usual sterile fashion, a Foley catheter was inserted, a Hulka tenaculum was applied to the cervix for uterine manipulation. Infraumbilical skin was then infiltrated with quarter percent Marcaine  and a 1 cm vertical incision was made. The veress needle was inserted into the peritoneal cavity and placement confirmed by the water  drop test and an opening pressure of 7 mm of mercury. CO2 was insufflated to a pressure of 14 mm of mercury and the veress needle was removed. A 10/11 disposable trocar was then introduced with direct visualization with the laparoscope. A 5 mm port was then placed low in the midline also under direct visualization. Inspection revealed the above-mentioned findings with normal anatomy. The distal end of each tube was grasped and elevated. Using bipolar cautery I was able to free the distal end of each tube from the ovary and fulgurate across the mesosalpinx and a proximal portion of the fallopian tube. Scissors were then used to remove the fallopian tube. This is done bilaterally without difficulty. Both segments of tube were removed through the umbilical trocar. The 5 mm port was removed under direct visualization. All gas was allowed to deflate from the abdomen and the umbilical trocar was removed. One figure-of-eight suture of 0 Vicryl was placed  in the umbilical incision. Skin incisions were then closed with interrupted subcuticular sutures of 4-0 Vicryl followed by Dermabond. The Hulka tenaculum and Foley were removed. The patient was taken down from stirrups. She was awakened in the operating room and taken to the recovery room in stable condition after tolerating the procedure well. Counts were correct and she had PAS hose on throughout the procedure.

## 2024-03-07 NOTE — Discharge Instr - Supplementary Instructions (Signed)
 May take Ibuprofen ,Advil  or Aleve after 6:45pm if needed for discomfort.

## 2024-03-08 ENCOUNTER — Encounter (HOSPITAL_COMMUNITY): Payer: Self-pay | Admitting: Obstetrics and Gynecology

## 2024-03-11 LAB — SURGICAL PATHOLOGY
